# Patient Record
Sex: Male | Born: 1935 | Race: Asian | Hispanic: Yes | Smoking: Former smoker
Health system: Southern US, Community
[De-identification: ages and names within clinical notes are randomized; demographics above are authoritative.]

## PROBLEM LIST (undated history)

## (undated) DIAGNOSIS — I10 Essential (primary) hypertension: Secondary | ICD-10-CM

## (undated) DIAGNOSIS — G589 Mononeuropathy, unspecified: Secondary | ICD-10-CM

---

## 2015-06-10 ENCOUNTER — Inpatient Hospital Stay (HOSPITAL_COMMUNITY)
Admission: EM | Admit: 2015-06-10 | Discharge: 2015-06-13 | DRG: 064 | Disposition: A | Discharge status: Home / Self Care | Payer: Medicare Other | Attending: Neurology | Admitting: Neurology

## 2015-06-10 ENCOUNTER — Encounter (HOSPITAL_COMMUNITY): Payer: Self-pay | Admitting: Cardiology

## 2015-06-10 ENCOUNTER — Inpatient Hospital Stay (HOSPITAL_COMMUNITY): Payer: Medicare Other

## 2015-06-10 ENCOUNTER — Emergency Department (HOSPITAL_COMMUNITY): Payer: Medicare Other

## 2015-06-10 DIAGNOSIS — G936 Cerebral edema: Secondary | ICD-10-CM | POA: Diagnosis present

## 2015-06-10 DIAGNOSIS — I610 Nontraumatic intracerebral hemorrhage in hemisphere, subcortical: Secondary | ICD-10-CM | POA: Diagnosis not present

## 2015-06-10 DIAGNOSIS — R402142 Coma scale, eyes open, spontaneous, at arrival to emergency department: Secondary | ICD-10-CM | POA: Diagnosis present

## 2015-06-10 DIAGNOSIS — E876 Hypokalemia: Secondary | ICD-10-CM | POA: Diagnosis not present

## 2015-06-10 DIAGNOSIS — I619 Nontraumatic intracerebral hemorrhage, unspecified: Secondary | ICD-10-CM | POA: Diagnosis not present

## 2015-06-10 DIAGNOSIS — K5909 Other constipation: Secondary | ICD-10-CM | POA: Diagnosis not present

## 2015-06-10 DIAGNOSIS — R270 Ataxia, unspecified: Secondary | ICD-10-CM | POA: Diagnosis present

## 2015-06-10 DIAGNOSIS — R402362 Coma scale, best motor response, obeys commands, at arrival to emergency department: Secondary | ICD-10-CM | POA: Diagnosis present

## 2015-06-10 DIAGNOSIS — I611 Nontraumatic intracerebral hemorrhage in hemisphere, cortical: Principal | ICD-10-CM | POA: Diagnosis present

## 2015-06-10 DIAGNOSIS — E858 Other amyloidosis: Secondary | ICD-10-CM | POA: Diagnosis present

## 2015-06-10 DIAGNOSIS — R29702 NIHSS score 2: Secondary | ICD-10-CM | POA: Diagnosis present

## 2015-06-10 DIAGNOSIS — R402252 Coma scale, best verbal response, oriented, at arrival to emergency department: Secondary | ICD-10-CM | POA: Diagnosis present

## 2015-06-10 DIAGNOSIS — G500 Trigeminal neuralgia: Secondary | ICD-10-CM | POA: Diagnosis present

## 2015-06-10 DIAGNOSIS — I10 Essential (primary) hypertension: Secondary | ICD-10-CM | POA: Diagnosis present

## 2015-06-10 DIAGNOSIS — I6789 Other cerebrovascular disease: Secondary | ICD-10-CM | POA: Diagnosis not present

## 2015-06-10 HISTORY — DX: Mononeuropathy, unspecified: G58.9

## 2015-06-10 HISTORY — DX: Essential (primary) hypertension: I10

## 2015-06-10 LAB — COMPREHENSIVE METABOLIC PANEL
ALBUMIN: 4.3 g/dL (ref 3.5–5.0)
ALBUMIN: 3.9 g/dL (ref 3.5–5.0)
ALK PHOS: 58 U/L (ref 38–126)
ALK PHOS: 53 U/L (ref 38–126)
ALT: 19 U/L (ref 17–63)
ALT: 18 U/L (ref 17–63)
AST: 29 U/L (ref 15–41)
AST: 22 U/L (ref 15–41)
Anion gap: 16 — ABNORMAL HIGH (ref 5–15)
Anion gap: 8 (ref 5–15)
BUN: 15 mg/dL (ref 6–20)
BUN: 11 mg/dL (ref 6–20)
CALCIUM: 9 mg/dL (ref 8.9–10.3)
CO2: 18 mmol/L — ABNORMAL LOW (ref 22–32)
CO2: 23 mmol/L (ref 22–32)
CREATININE: 1.02 mg/dL (ref 0.61–1.24)
CREATININE: 0.71 mg/dL (ref 0.61–1.24)
Calcium: 9.3 mg/dL (ref 8.9–10.3)
Chloride: 106 mmol/L (ref 101–111)
Chloride: 106 mmol/L (ref 101–111)
GFR calc Af Amer: >60 mL/min (ref >60)
GFR calc Af Amer: >60 mL/min (ref >60)
GFR calc non Af Amer: >60 mL/min (ref >60)
GFR calc non Af Amer: >60 mL/min (ref >60)
GLUCOSE: 141 mg/dL — AB (ref 65–99)
GLUCOSE: 113 mg/dL — AB (ref 65–99)
POTASSIUM: 3.7 mmol/L (ref 3.5–5.1)
Potassium: 3.4 mmol/L — ABNORMAL LOW (ref 3.5–5.1)
SODIUM: 140 mmol/L (ref 135–145)
SODIUM: 137 mmol/L (ref 135–145)
TOTAL PROTEIN: 7.7 g/dL (ref 6.5–8.1)
Total Bilirubin: 1 mg/dL (ref 0.3–1.2)
Total Bilirubin: 0.9 mg/dL (ref 0.3–1.2)
Total Protein: 6.9 g/dL (ref 6.5–8.1)

## 2015-06-10 LAB — CBC
HCT: 41.5 % (ref 39.0–52.0)
HEMATOCRIT: 39.4 % (ref 39.0–52.0)
Hemoglobin: 14.2 g/dL (ref 13.0–17.0)
Hemoglobin: 13.6 g/dL (ref 13.0–17.0)
MCH: 31.8 pg (ref 26.0–34.0)
MCH: 31.6 pg (ref 26.0–34.0)
MCHC: 34.2 g/dL (ref 30.0–36.0)
MCHC: 34.5 g/dL (ref 30.0–36.0)
MCV: 92.8 fL (ref 78.0–100.0)
MCV: 91.6 fL (ref 78.0–100.0)
PLATELETS: 131 10*3/uL — AB (ref 150–400)
PLATELETS: 122 10*3/uL — AB (ref 150–400)
RBC: 4.47 MIL/uL (ref 4.22–5.81)
RBC: 4.3 MIL/uL (ref 4.22–5.81)
RDW: 12.9 % (ref 11.5–15.5)
RDW: 12.8 % (ref 11.5–15.5)
WBC: 8 10*3/uL (ref 4.0–10.5)
WBC: 7 10*3/uL (ref 4.0–10.5)

## 2015-06-10 LAB — APTT
aPTT: 29 seconds (ref 24–37)
aPTT: 31 seconds (ref 24–37)

## 2015-06-10 LAB — PROTIME-INR
INR: 1.11 (ref 0.00–1.49)
INR: 1.18 (ref 0.00–1.49)
PROTHROMBIN TIME: 14.5 s (ref 11.6–15.2)
Prothrombin Time: 15.2 seconds (ref 11.6–15.2)

## 2015-06-10 LAB — I-STAT CHEM 8, ED
BUN: 17 mg/dL (ref 6–20)
CALCIUM ION: 1.1 mmol/L — AB (ref 1.13–1.30)
CHLORIDE: 105 mmol/L (ref 101–111)
CREATININE: 0.9 mg/dL (ref 0.61–1.24)
Glucose, Bld: 133 mg/dL — ABNORMAL HIGH (ref 65–99)
HCT: 44 % (ref 39.0–52.0)
Hemoglobin: 15 g/dL (ref 13.0–17.0)
Potassium: 3.6 mmol/L (ref 3.5–5.1)
SODIUM: 141 mmol/L (ref 135–145)
TCO2: 18 mmol/L (ref 0–100)

## 2015-06-10 LAB — DIFFERENTIAL
Basophils Absolute: 0 10*3/uL (ref 0.0–0.1)
Basophils Relative: 1 %
Eosinophils Absolute: 0.1 10*3/uL (ref 0.0–0.7)
Eosinophils Relative: 2 %
LYMPHS PCT: 24 %
Lymphs Abs: 1.9 10*3/uL (ref 0.7–4.0)
MONO ABS: 0.5 10*3/uL (ref 0.1–1.0)
Monocytes Relative: 6 %
NEUTROS ABS: 5.5 10*3/uL (ref 1.7–7.7)
Neutrophils Relative %: 67 %

## 2015-06-10 LAB — CBG MONITORING, ED: Glucose-Capillary: 120 mg/dL — ABNORMAL HIGH (ref 65–99)

## 2015-06-10 LAB — I-STAT TROPONIN, ED: Troponin i, poc: 0 ng/mL (ref 0.00–0.08)

## 2015-06-10 LAB — MRSA PCR SCREENING: MRSA by PCR: NEGATIVE

## 2015-06-10 MED ORDER — SODIUM CHLORIDE 0.9 % IV SOLN
INTRAVENOUS | Status: DC
  Administered 2015-06-10 – 2015-06-12 (×3): via INTRAVENOUS

## 2015-06-10 MED ORDER — STROKE: EARLY STAGES OF RECOVERY BOOK
Freq: Once | Status: AC
  Administered 2015-06-10: 20:00:00
  Filled 2015-06-10: qty 1

## 2015-06-10 MED ORDER — HYDROMORPHONE HCL 1 MG/ML IJ SOLN
INTRAMUSCULAR | Status: AC
Start: 2015-06-10 — End: 2015-06-10
  Filled 2015-06-10: qty 1

## 2015-06-10 MED ORDER — HYDROMORPHONE HCL 1 MG/ML IJ SOLN
1.0000 mg | INTRAMUSCULAR | Status: DC | PRN
  Administered 2015-06-10 – 2015-06-11 (×2): 1 mg via INTRAVENOUS
  Filled 2015-06-10: qty 1

## 2015-06-10 MED ORDER — LABETALOL HCL 5 MG/ML IV SOLN
5.0000 mg | Freq: Once | INTRAVENOUS | Status: AC
  Administered 2015-06-10: 5 mg via INTRAVENOUS
  Filled 2015-06-10: qty 4

## 2015-06-10 MED ORDER — ACETAMINOPHEN 325 MG PO TABS: 650.0000 mg | ORAL_TABLET | ORAL | Status: DC | PRN

## 2015-06-10 MED ORDER — LABETALOL HCL 5 MG/ML IV SOLN
10.0000 mg | INTRAVENOUS | Status: DC | PRN
  Administered 2015-06-11 (×2): 20 mg via INTRAVENOUS
  Administered 2015-06-11: 40 mg via INTRAVENOUS
  Administered 2015-06-11 – 2015-06-13 (×6): 20 mg via INTRAVENOUS
  Filled 2015-06-10: qty 8
  Filled 2015-06-10: qty 4
  Filled 2015-06-10: qty 8
  Filled 2015-06-10: qty 4
  Filled 2015-06-10 (×4): qty 8
  Filled 2015-06-10: qty 4
  Filled 2015-06-10 (×3): qty 8

## 2015-06-10 MED ORDER — PANTOPRAZOLE SODIUM 40 MG IV SOLR
40.0000 mg | Freq: Every day | INTRAVENOUS | Status: DC
  Administered 2015-06-10 – 2015-06-11 (×2): 40 mg via INTRAVENOUS
  Filled 2015-06-10 (×2): qty 40

## 2015-06-10 MED ORDER — SENNOSIDES-DOCUSATE SODIUM 8.6-50 MG PO TABS
1.0000 | ORAL_TABLET | Freq: Two times a day (BID) | ORAL | Status: DC
  Administered 2015-06-11 – 2015-06-13 (×5): 1 via ORAL
  Filled 2015-06-10 (×6): qty 1

## 2015-06-10 MED ORDER — NICARDIPINE HCL IN NACL 20-0.86 MG/200ML-% IV SOLN
3.0000 mg/h | INTRAVENOUS | Status: DC
  Administered 2015-06-10: 5 mg/h via INTRAVENOUS
  Administered 2015-06-12: 3 mg/h via INTRAVENOUS
  Filled 2015-06-10 (×3): qty 200

## 2015-06-10 MED ORDER — ACETAMINOPHEN 650 MG RE SUPP: 650.0000 mg | RECTAL | Status: DC | PRN

## 2015-06-10 NOTE — ED Notes (Signed)
Pt to department via EMS- pt was LSN at 8am this morning. Wife reports at 11am that patient was acting confused and unable to dress himself. [place] then went [place] the airport and was found unresponsive in the bathroom. On EMS arrival pt was alert but confused. Pt reports he has had a headache for the past couple of days. Bp-156/96 Hr-94 CBg-122 Pt has weakness to the left side.

## 2015-06-10 NOTE — H&P (Signed)
H&P   Chief Complaint: Code stroke  HPI:                                                                                                                                         Todd Hoffman is an 79 y.o. male with known HTN.  Patient was last seen normal last night. This AM his wife noted he was putting his pants on backwards and not acting normal.  They went to the airport to catch a flight and he was found down in the bathroom. He has no recollection of this. EMS noted some left sided ataxia and called a code stroke.  CT was noted to show an acute hemorrhage right parietal lobe with surrounding edema and a small amount of subarachnoid hemorrhage with differential diagnosis that includes amyloid, trauma, vascular malformation and possibly underlying tumor.  Date last known well: Date: 06/09/2015 Time last known well: Unable to determine tPA Given: No: ICH Blood volume: 3.5 cc ICH score: 0    Past Medical History  Diagnosis Date  . Hypertension     History reviewed. No pertinent past surgical history.  Family History  Problem Relation Age of Onset  . Hypertension Mother   . Hypertension Father    Social History:  reports that he has never smoked. He does not have any smokeless tobacco history on file. He reports that he drinks alcohol. He reports that he does not use illicit drugs. Family history: no epilepsy, MS, or brain tumor Allergies: No Known Allergies  Medications:                                                                                                                           No current facility-administered medications for this encounter.   No current outpatient prescriptions on file.     ROS:  History obtained from the patient  General ROS: negative for - chills, fatigue, fever, night sweats, weight gain or weight  loss Psychological ROS: negative for - behavioral disorder, hallucinations, memory difficulties, mood swings or suicidal ideation Ophthalmic ROS: negative for - blurry vision, double vision, eye pain or loss of vision ENT ROS: negative for - epistaxis, nasal discharge, oral lesions, sore throat, tinnitus or vertigo Allergy and Immunology ROS: negative for - hives or itchy/watery eyes Hematological and Lymphatic ROS: negative for - bleeding problems, bruising or swollen lymph nodes Endocrine ROS: negative for - galactorrhea, hair pattern changes, polydipsia/polyuria or temperature intolerance Respiratory ROS: negative for - cough, hemoptysis, shortness of breath or wheezing Cardiovascular ROS: negative for - chest pain, dyspnea on exertion, edema or irregular heartbeat Gastrointestinal ROS: negative for - abdominal pain, diarrhea, hematemesis, nausea/vomiting or stool incontinence Genito-Urinary ROS: negative for - dysuria, hematuria, incontinence or urinary frequency/urgency Musculoskeletal ROS: negative for - joint swelling or muscular weakness Neurological ROS: as noted in HPI Dermatological ROS: negative for rash and skin lesion changes  Neurologic Examination:                                                                                                      Blood pressure 159/90, pulse 86, temperature 98.4 F (36.9 C), temperature source Oral, resp. rate 21, SpO2 96 %.  HEENT-  Normocephalic, no lesions, without obvious abnormality.  Normal external eye and conjunctiva.  Normal TM's bilaterally.  Normal auditory canals and external ears. Normal external nose, mucus membranes and septum.  Normal pharynx. Cardiovascular- S1, S2 normal, pulses palpable throughout   Lungs- chest clear, no wheezing, rales, normal symmetric air entry Abdomen- normal findings: bowel sounds normal Extremities- no edema Lymph-no adenopathy palpable Musculoskeletal-no joint tenderness, deformity or  swelling Skin-warm and dry, no hyperpigmentation, vitiligo, or suspicious lesions  Neurological Examination Mental Status: Alert, oriented, thought content appropriate.  Speech fluent without evidence of aphasia.  Able to follow 3 step commands without difficulty. Cranial Nerves: II: Discs flat bilaterally; Visual fields shows left hemianopsia, pupils equal, round, reactive to light and accommodation III,IV, VI: ptosis not present, extra-ocular motions intact bilaterally V,VII: smile symmetric, facial light touch sensation decreased on the left VIII: hearing normal bilaterally IX,X: uvula rises symmetrically XI: bilateral shoulder shrug XII: midline tongue extension Motor: Right : Upper extremity   5/5    Left:     Upper extremity   5/5  Lower extremity   5/5     Lower extremity   5/5 Tone and bulk:normal tone throughout; no atrophy noted Sensory: Pinprick and light touch decreased on the left Deep Tendon Reflexes: 2+ and symmetric throughout Plantars: Right: downgoing   Left: downgoing Cerebellar: normal finger-to-nose and normal heel-to-shin test Gait: Not tested     Lab Results: Basic Metabolic Panel:  Recent Labs Lab 06/10/15 1442 06/10/15 1448  NA 140 141  K 3.7 3.6  CL 106 105  CO2 18*  --   GLUCOSE 141* 133*  BUN 15 17  CREATININE 1.02 0.90  CALCIUM 9.3  --  Liver Function Tests:  Recent Labs Lab 06/10/15 1442  AST 29  ALT 19  ALKPHOS 58  BILITOT 1.0  PROT 7.7  ALBUMIN 4.3   No results for input(s): LIPASE, AMYLASE in the last 168 hours. No results for input(s): AMMONIA in the last 168 hours.  CBC:  Recent Labs Lab 06/10/15 1442 06/10/15 1448  WBC 8.0  --   NEUTROABS 5.5  --   HGB 14.2 15.0  HCT 41.5 44.0  MCV 92.8  --   PLT 131*  --     Cardiac Enzymes: No results for input(s): CKTOTAL, CKMB, CKMBINDEX, TROPONINI in the last 168 hours.  Lipid Panel: No results for input(s): CHOL, TRIG, HDL, CHOLHDL, VLDL, LDLCALC in the last 168  hours.  CBG:  Recent Labs Lab 06/10/15 1511  GLUCAP 120*    Microbiology: No results found for this or any previous visit.  Coagulation Studies:  Recent Labs  06/10/15 1442  LABPROT 14.5  INR 1.11    Imaging: Ct Head Wo Contrast  06/10/2015  CLINICAL DATA:  Code stroke.  Left-sided weakness. EXAM: CT HEAD WITHOUT CONTRAST TECHNIQUE: Contiguous axial images were obtained from the base of the skull through the vertex without intravenous contrast. COMPARISON:  None. FINDINGS: Acute hemorrhage in the right parietal lobe. This is a peripheral parenchymal hemorrhage measuring 25 x 16 mm. There is a small amount of adjacent subarachnoid hemorrhage. No subdural hemorrhage. Moderate atrophy and moderate chronic microvascular ischemia. Negative for acute ischemic infarction. No underlying mass lesion identified Calvarium intact. IMPRESSION: 25 x 16 mm acute hemorrhage right parietal lobe with surrounding edema and a small amount of subarachnoid hemorrhage. Differential diagnosis includes amyloid, trauma, vascular malformation and possibly underlying tumor. No definite tumor mass is identified. Critical Value/emergent results were called by telephone at the time of interpretation on 06/10/2015 at 3:06 pm to Dr. Leroy Kennedy, who verbally acknowledged these results. Electronically Signed   By: Marlan Palau M.D.   On: 06/10/2015 15:06       Assessment and plan discussed with with attending physician and they are in agreement.    Felicie Morn PA-C [facility] [phone]  06/10/2015, 3:35 PM   Assessment: 79 y.o. male presenting to ED as a code stroke after being found down in the airport. CT head confirms an acute hemorrhage right parietal lobe with surrounding edema and a small amount of subarachnoid hemorrhage with differential diagnosis that includes amyloid, trauma, vascular malformation and possibly underlying tumor.   ICH score 0 Admit to NICU.   Stroke Risk Factors -  hypertension  Recommend: 1) admit to neuro ICU 2) SBP <140 and DBP<90 3) NPO until SLP eval 4) MRI/MRA brain with and without contrast Stroke team will follow up tomorrow.  Patient seen and examined together with physician assistant and I concur with the assessment and plan.  Wyatt Portela, MD

## 2015-06-10 NOTE — Code Documentation (Signed)
79 [facility] that was called in the field by [facility].  Wife reports that patient has complained of headache for several days - this morning he awakened and seemed normal but had some confusion - i.e. Putting clothes on backwards etc.  He was ambulatory and assisted with house cleaning.  [place] - he went to the restroom and when he did not come back in a timely manner the wife sent someone in looking for him where he was found on the floor.  EMS was called and they saw some ataxia and confusion.  On arrival to ED he is alert - a little slow to respond to questions and is confused as times.  He has a left field cut but does not extinct.  Pupils are 3mm ERL.  NIHHS 2 for field cut and sensory loss although sensory loss is inconsistent.  He does report some neuropathy.  LSW is questionable if he awoke with symptoms.  CT scan show ICH. BP control began with Labetalol.  Handoff to [employer] RN - BP goal is <140/90.  Patient and wife updated by Felicie Mornavid Smith PA and Dr. Cyril Mourningamillo.

## 2015-06-10 NOTE — ED Notes (Signed)
Pt requesting to use  Bathroom, urinal given to pt.  Family at bedside

## 2015-06-10 NOTE — ED Provider Notes (Signed)
CSN: [id number]     Arrival date & time 06/10/15  1437 History   First MD Initiated Contact with Patient 06/10/15 1443     Chief Complaint  Patient presents with  . Code Stroke   Patient is a 79 y.o. male presenting with neurologic complaint. The history is provided by the patient.  Neurologic Problem This is a new problem. The current episode started today. The problem occurs constantly. The problem has been unchanged. Associated symptoms include diaphoresis, headaches and weakness. Pertinent negatives include no abdominal pain, chest pain, chills, coughing, fever, nausea, neck pain, rash, sore throat, vertigo or vomiting. Nothing aggravates the symptoms.    Past Medical History  Diagnosis Date  . Hypertension    History reviewed. No pertinent past surgical history. No family history on file. Social History  Substance Use Topics  . Smoking status: Never Smoker   . Smokeless tobacco: None  . Alcohol Use: Yes    Review of Systems  Constitutional: Positive for diaphoresis. Negative for fever and chills.  HENT: Negative for rhinorrhea and sore throat.   Eyes: Negative for visual disturbance.  Respiratory: Negative for cough and shortness of breath.   Cardiovascular: Negative for chest pain.  Gastrointestinal: Negative for nausea, vomiting, abdominal pain, diarrhea and constipation.  Genitourinary: Negative for dysuria and hematuria.  Musculoskeletal: Negative for back pain and neck pain.  Skin: Negative for rash.  Neurological: Positive for weakness and headaches. Negative for vertigo and syncope.  Psychiatric/Behavioral: Positive for confusion.  All other systems reviewed and are negative.  Allergies  Review of patient's allergies indicates no known allergies.  Home Medications   Prior to Admission medications   Not on File   BP 166/89 mmHg  Pulse 92  Temp(Src) 98.4 F (36.9 C) (Oral)  Resp 18  SpO2 95% Physical Exam  Constitutional: He is oriented to person,  place, and time. He appears well-developed and well-nourished. No distress.  HENT:  Head: Normocephalic and atraumatic.  Mouth/Throat: Oropharynx is clear and moist.  Eyes: EOM are normal.  Neck: Neck supple. No JVD present.  Cardiovascular: Normal rate, regular rhythm, normal heart sounds and intact distal pulses.   Pulmonary/Chest: Effort normal and breath sounds normal.  Abdominal: Soft. He exhibits no distension. There is no tenderness.  Musculoskeletal: Normal range of motion. He exhibits no edema.  Neurological: He is alert and oriented to person, place, and time. He has normal strength. No cranial nerve deficit or sensory deficit. Coordination normal. GCS eye subscore is 4. GCS verbal subscore is 5. GCS motor subscore is 6.  Reflex Scores:      Bicep reflexes are 2+ on the right side and 2+ on the left side.      Brachioradialis reflexes are 2+ on the right side and 2+ on the left side.      Patellar reflexes are 2+ on the right side and 2+ on the left side. Left visual field cut. 5/5 BUE and BLE strength. Symmetric sensation.  Skin: Skin is warm and dry.  Psychiatric: His behavior is normal.    ED Course  Procedures  None   Labs Review Labs Reviewed  CBC - Abnormal; Notable for the following:    Platelets 131 (*)    All other components within normal limits  I-STAT CHEM 8, ED - Abnormal; Notable for the following:    Glucose, Bld 133 (*)    Calcium, Ion 1.10 (*)    All other components within normal limits  PROTIME-INR  APTT  DIFFERENTIAL  COMPREHENSIVE METABOLIC PANEL  I-STAT TROPOININ, ED  CBG MONITORING, ED    Imaging Review Ct Head Wo Contrast  06/10/2015  CLINICAL DATA:  Code stroke.  Left-sided weakness. EXAM: CT HEAD WITHOUT CONTRAST TECHNIQUE: Contiguous axial images were obtained from the base of the skull through the vertex without intravenous contrast. COMPARISON:  None. FINDINGS: Acute hemorrhage in the right parietal lobe. This is a peripheral  parenchymal hemorrhage measuring 25 x 16 mm. There is a small amount of adjacent subarachnoid hemorrhage. No subdural hemorrhage. Moderate atrophy and moderate chronic microvascular ischemia. Negative for acute ischemic infarction. No underlying mass lesion identified Calvarium intact. IMPRESSION: 25 x 16 mm acute hemorrhage right parietal lobe with surrounding edema and a small amount of subarachnoid hemorrhage. Differential diagnosis includes amyloid, trauma, vascular malformation and possibly underlying tumor. No definite tumor mass is identified. Critical Value/emergent results were called by telephone at the time of interpretation on 06/10/2015 at 3:06 pm to Dr. Leroy KennedyAmilo, who verbally acknowledged these results. Electronically Signed   By: Marlan Palauharles  Clark M.D.   On: 06/10/2015 15:06   I have personally reviewed and evaluated these images and lab results as part of my medical decision-making.  MDM   Final diagnoses:  Intraparenchymal hemorrhage of brain [facility](HCC)   79 year old male presents by EMS for code stroke. Patient's last known normal was 11 AM (approximately 3 hours and 45 minutes prior to arrival). Patient was noted to be acting confused, putting his clothes on backwards. He was at the airport and someone found him down and a restroom stall. No evidence of trauma. GCS 14. Airway intact and maintainable. Left visual field cut. Strong motor exam bilaterally. Patient taken to CT scanner. CT shows acute right parietal IPH. Neurology at bedside, updating family. Will admit to Neurology service. Care assumed by them.  Discussed with Dr. Jodi MourningZavitz.    Maris BergerJonah Margareta Laureano, MD 06/10/15 1512  Blane OharaJoshua Zavitz, MD 06/10/15 [phone]

## 2015-06-10 NOTE — ED Notes (Signed)
Stroke team remains at the bedside. 

## 2015-06-11 ENCOUNTER — Inpatient Hospital Stay (HOSPITAL_COMMUNITY): Payer: Medicare Other

## 2015-06-11 ENCOUNTER — Encounter (HOSPITAL_COMMUNITY): Payer: Self-pay

## 2015-06-11 DIAGNOSIS — G936 Cerebral edema: Secondary | ICD-10-CM

## 2015-06-11 DIAGNOSIS — I6789 Other cerebrovascular disease: Secondary | ICD-10-CM

## 2015-06-11 LAB — GLUCOSE, CAPILLARY
GLUCOSE-CAPILLARY: 108 mg/dL — AB (ref 65–99)
GLUCOSE-CAPILLARY: 95 mg/dL (ref 65–99)
Glucose-Capillary: 104 mg/dL — ABNORMAL HIGH (ref 65–99)

## 2015-06-11 MED ORDER — GABAPENTIN 400 MG PO CAPS
400.0000 mg | ORAL_CAPSULE | Freq: Three times a day (TID) | ORAL | Status: DC
  Administered 2015-06-11 – 2015-06-12 (×5): 400 mg via ORAL
  Filled 2015-06-11 (×6): qty 1

## 2015-06-11 MED ORDER — POTASSIUM CHLORIDE CRYS ER 20 MEQ PO TBCR
20.0000 meq | EXTENDED_RELEASE_TABLET | ORAL | Status: AC
  Administered 2015-06-11 (×2): 20 meq via ORAL
  Filled 2015-06-11 (×2): qty 1

## 2015-06-11 NOTE — Evaluation (Signed)
Speech Language Pathology Evaluation Patient Details Name: Todd LivingsRobert Hoffman MRN: [id number] DOB: 01/10/1936 Today's Date: 06/11/2015 Time: [id number] SLP Time Calculation (min) (ACUTE ONLY): 28 min  Problem List:  Patient Active Problem List   Diagnosis Date Noted  . ICH (intracerebral hemorrhage) (HCC) 06/10/2015   Past Medical History:  Past Medical History  Diagnosis Date  . Hypertension    Past Surgical History: History reviewed. No pertinent past surgical history. HPI:  pt presents with R Parietal and Occipital lobe ICH. pt with hx of HTN.    Assessment / Plan / Recommendation Clinical Impression  Pt scored a 22/30 on the MoCA (below 26 indicative of cognitive impairment), with errors in large part due to poor sustained attention, impulsivity, and decreased working memory. This makes storage of new information very difficult, although he actually has adequate delayed recall when information is able to be encoded. Difficulties are also noted with basic functional problem solving and more complex verbal problem solving. Word finding errors are noted at the conversational level, and are likely a result of impaired cognitive function. Despite specific testing errors explained to pt, he still needed Total A for awareness of difficulties, making safety a big concern. Given his higher level of verbal function, pt tries to provide explanations for all of the functional deficits he shows. Wife and son present for SLP recommendations and education. Recommend acute f/u as well as 24/7 supervision and OP SLP upon discharge.    SLP Assessment  Patient needs continued Speech Lanaguage Pathology Services    Follow Up Recommendations  Outpatient SLP;24 hour supervision/assistance    Frequency and Duration min 2x/week  2 weeks      SLP Evaluation Prior Functioning  Cognitive/Linguistic Baseline: Within functional limits Type of Home: House  Lives With: Spouse Available Help at Discharge:  Family Vocation: Part time employment   Cognition  Overall Cognitive Status: Impaired/Different from baseline Arousal/Alertness: Awake/alert Orientation Level: Oriented X4 Attention: Sustained Sustained Attention: Impaired Sustained Attention Impairment: Verbal basic;Functional basic Memory: Impaired Memory Impairment: Storage deficit Awareness: Impaired Awareness Impairment: Intellectual impairment;Emergent impairment;Anticipatory impairment Problem Solving: Impaired Problem Solving Impairment: Functional basic;Verbal complex Behaviors: Impulsive Safety/Judgment: Impaired    Comprehension  Auditory Comprehension Overall Auditory Comprehension: Impaired Commands: Impaired One Step Basic Commands: 75-100% accurate Complex Commands: 25-49% accurate Conversation: Simple Interfering Components: Attention;Working [employer] fast streamermemory;Other (comment) (impulsivity)    Expression Expression Primary Mode of Expression: Verbal Verbal Expression Overall Verbal Expression: Impaired Initiation: No impairment Level of Generative/Spontaneous Verbalization: Conversation Naming: Impairment Verbal Errors: Other (comment) (word-finding errors in conversation) Non-Verbal Means of Communication: Not applicable Written Expression Dominant Hand: Right   Oral / Motor Oral Motor/Sensory Function Overall Oral Motor/Sensory Function: Within functional limits Motor Speech Overall Motor Speech: Appears within functional limits for tasks assessed     Maxcine HamLaura Paiewonsky, M.A. CCC-SLP [phone]  Maxcine Hamaiewonsky, Todd Hoffman 06/11/2015, 4:29 PM

## 2015-06-11 NOTE — Progress Notes (Signed)
STROKE TEAM PROGRESS NOTE   HISTORY Todd Hoffman is an 79 y.o. male with known HTN. Patient was last seen normal last night (LKW 06/09/2015, unable to determine time). This AM 06/10/2015 his wife noted he was putting his pants on backwards and not acting normal. [place] went to the airport to [place] a flight and he was found down in the bathroom. He has no recollection of this. EMS noted some left sided ataxia and called a code stroke. CT was noted to show an acute hemorrhage right parietal lobe with surrounding edema and a small amount of subarachnoid hemorrhage with differential diagnosis that includes amyloid, trauma, vascular malformation and possibly underlying tumor. Blood volume: 3.5 cc ICH score: 0. He was admitted to the neuro ICU for further evaluation and treatment.   SUBJECTIVE (INTERVAL HISTORY) No family is at the bedside.  Overall he feels his condition is improving. Patient unable to recount yesterday's history with Dr. Pearlean Brownie.    OBJECTIVE Temp:  [98.2 F (36.8 C)-98.6 F (37 C)] 98.2 F (36.8 C) (12/13 0756) Pulse Rate:  [56-97] 74 (12/13 0800) Cardiac Rhythm:  [-] Normal sinus rhythm (12/13 0800) Resp:  [9-22] 18 (12/13 0800) BP: (97-169)/(60-101) 137/70 mmHg (12/13 0800) SpO2:  [92 %-100 %] 97 % (12/13 0800) Weight:  [79.7 kg (175 lb 11.3 oz)] 79.7 kg (175 lb 11.3 oz) (12/12 2300)  CBC:   Recent Labs Lab 06/10/15 1442 06/10/15 1448 06/10/15 2030  WBC 8.0  --  7.0  NEUTROABS 5.5  --   --   HGB 14.2 15.0 13.6  HCT 41.5 44.0 39.4  MCV 92.8  --  91.6  PLT 131*  --  122*    Basic Metabolic Panel:   Recent Labs Lab 06/10/15 1442 06/10/15 1448 06/10/15 2030  NA 140 141 137  K 3.7 3.6 3.4*  CL 106 105 106  CO2 18*  --  23  GLUCOSE 141* 133* 113*  BUN CREATININE 1.02 0.90 0.71  CALCIUM 9.3  --  9.0    Lipid Panel: No results found for: CHOL, TRIG, HDL, CHOLHDL, VLDL, LDLCALC HgbA1c: No results found for: HGBA1C Urine Drug Screen: No results  found for: LABOPIA, COCAINSCRNUR, LABBENZ, AMPHETMU, THCU, LABBARB    IMAGING  Ct Head Wo Contrast 06/11/2015   1. Stable size of right occipital 26 x 16 mm with parenchymal hemorrhage with mild localized vasogenic edema. 2. Slight interval decrease in adjacent small volume acute subarachnoid hemorrhage within the right parietal lobe. 3. No other new intracranial process.   Ct Head Wo Contrast 06/10/2015  25 x 16 mm acute hemorrhage right parietal lobe with surrounding edema and a small amount of subarachnoid hemorrhage. Differential diagnosis includes amyloid, trauma, vascular malformation and possibly underlying tumor. No definite tumor mass is identified.   MRI HEAD  06/10/2015  1. 26 x 14 x 18 mm acute parenchymal hemorrhage within the right occipital lobe with mild localized vasogenic edema and small amount of adjacent subarachnoid hemorrhage. Primary differential considerations include possible trauma or amyloid angiopathy. No definite underlying mass lesion on this noncontrast examination. No underlying vascular malformation. 2. Moderate chronic microvascular ischemic disease.   MRA HEAD  06/10/2015  1. Short segment severe stenosis within the mid left P2 segment. 2. Otherwise negative intracranial MRA.      PHYSICAL EXAM Pleasant elderly Hispanic male not in distress. . Afebrile. Head is nontraumatic. Neck is supple without bruit.    Cardiac exam no murmur or gallop. Lungs are clear  to auscultation. Distal pulses are well felt. Neurological Exam :  Awake alert oriented to time place and person. Speech is fluent without dysarthria or aphagia. Extraocular movements are full range without nystagmus. Fundi were not visualized. Vision acuity seems adequate. Partial left homonymous hemianopsia to bedside confrontational testing. Pupils equal reactive. Face appears symmetric with only mild minimal left lower facial asymmetry on smiling. Tongue midline. Motor system exam revealed no upper or  lower extremity drift but mild weakness of left grip and intrinsic hand muscles. Orbits right over left upper extremity. Symmetric lower extremity strength. Deep tendon reflexes are 2+ symmetric. Plantars are downgoing. Touch and pinprick sensation are preserved bilaterally. Coordination is intact. Gait was not tested. ASSESSMENT/PLAN Mr. Todd Hoffman is a 79 y.o. male with history of hypertension found down in the airport bathroom with left-sided ataxia, confusion prior to. Pt reports straining with BM at symptom onset. CT showed a right parietal hemorrhage.  Right parietal/occipital lobe hemorrhage with surrounding cerebral edema and small SAH hemorrhage, most likely hemorrhagic infarct vs hemorrhage due to amyloid. Doubt underlying tumor or hypertensive etiology at this time  Good memory, able to recall 3 objects in 3 minutes  Initial CT with right parietal/occipital lobe hemorrhage With edema and subarachnoid hemorrhage. No tumor identified.  Repeat CT with right parietal/occipital intraparenchymal hemorrhage with localized edema and slight decrease in subarachnoid hemorrhage in the right parietal  MRI  right parietal/occipital lobe hemorrhage with localized cerebral edema and adjacent subarachnoid hemorrhage. No tumor seen  MRA  Left P2 mid segment with severe stenosis, otherwise unremarkable  Carotid Doppler  pending   2D Echo  Ordered  HgbA1c pending  Lipids pending   SCDs for VTE prophylaxis Diet Heart Room service appropriate?: Yes; Fluid consistency:: Thin  No antithrombotic prior to admission  Ongoing aggressive stroke risk factor management  Therapy recommendations:  Pending. Ok to be OOB  Continue ICU level care x 1 day  Disposition:  pending   Hypertension  BP 152/90 on arrival  Had been started on cardene, now off  Remains Stable  At SBP goal < 140  Other Stroke Risk Factors  Advanced age  ETOH use  Other Active Problems  Thrombocytopenia, PLT  122  Hypokalemia, K 3.4 replace with Kdur 20 meq x 2  Trigeminal neuralgia  [place]. No[place]  On  Gabapentin at home, 300 tid daily  Still complains of pain  Will increase to 400 tid  Constipation  Chronic  colonscopy done PTA within past 2 years, no abnormal findings per pt  Hospital day # 1  Rhoderick MoodyBIBY,SHARON  [facility] See Amion for Pager information 06/11/2015 9:26 AM  I have personally examined this patient, reviewed notes, independently viewed imaging studies, participated in medical decision making and plan of care. I have made any additions or clarifications directly to the above note. Agree with note above. He presented with altered mental status, confusion and left-sided ataxia secondary to a small right parietal hemorrhage etiology indeterminate at the present time. He remains at risk for hematoma expansion, neurological worsening, seizures and needs ongoing close neurological follow-up and aggressive blood pressure control. No family available at the bedside This patient is critically ill and at significant risk of neurological worsening, death and care requires constant monitoring of vital signs, hemodynamics,respiratory and cardiac monitoring, extensive review of multiple databases, frequent neurological assessment, discussion with family, other specialists and medical decision making of high complexity.I have made any additions or  clarifications directly to the above note.This critical care time does not reflect procedure time, or teaching time or supervisory time of PA/NP/Med Resident etc but could involve care discussion time.  I spent 30 minutes of neurocritical care time  in the care of  this patient  Delia Heady, MD Medical Director [facility] Pager: [phone] 06/11/2015 3:32 PM    To contact Stroke Continuity provider, please refer to [email or web address]. After hours, contact General Neurology

## 2015-06-11 NOTE — Progress Notes (Signed)
PT Cancellation Note  Patient Details Name: Todd LivingsRobert Copes MRN: [id number] DOB: [birth year]   Cancelled Treatment:    Reason Eval/Treat Not Completed: Patient not medically ready.  Pt currently on bedrest at this time.  Please advance activity order once appropriate for PT and mobility.     [name], [name] 06/11/2015, 8:16 AM

## 2015-06-11 NOTE — Evaluation (Signed)
Physical Therapy Evaluation Patient Details Name: Todd Hoffman MRN: [id number] DOB: December 03, 1935 Today's Date: 06/11/2015   History of Present Illness  pt presents with R Parietal and Occipital lobe ICH.  pt with hx of HTN.    Clinical Impression  Pt moving well, but safety is limited by impulsivity and impaired cognition.  Pt has poor awareness of deficits and of his safety with mobility.  Feel pt would benefit from Speech for Cognition and will need A from family at home for med management and safety.  Will continue to follow.      Follow Up Recommendations Outpatient PT;Supervision for mobility/OOB    Equipment Recommendations  None recommended by PT    Recommendations for Other Services       Precautions / Restrictions Precautions Precautions: Other (comment) Precaution Comments: Impulsive Restrictions Weight Bearing Restrictions: No      Mobility  Bed Mobility Overal bed mobility: Modified Independent                Transfers Overall transfer level: Needs assistance Equipment used: None Transfers: Sit to/from Stand Sit to Stand: Supervision         General transfer comment: pt moves impulsively.  cues to attend to safety and lines.    Ambulation/Gait Ambulation/Gait assistance: Min guard Ambulation Distance (Feet): 150 Feet Assistive device: None Gait Pattern/deviations: Step-through pattern;Decreased stride length     General Gait Details: pt moves quickly and impulsively.  pt has difficulty multitasking with performing cognitive tasks and ambulating at the same time.  pt was able to find his room without A.    Stairs            Wheelchair Mobility    Modified Rankin (Stroke Patients Only) Modified Rankin (Stroke Patients Only) Pre-Morbid Rankin Score: No symptoms Modified Rankin: Moderately severe disability     Balance Overall balance assessment: Needs assistance Sitting-balance support: No upper extremity supported;Feet  supported Sitting balance-Leahy Scale: Good     Standing balance support: No upper extremity supported;During functional activity Standing balance-Leahy Scale: Fair Standing balance comment: Difficulties when multi tasking                             Pertinent Vitals/Pain Pain Assessment: No/denies pain    Home Living Family/patient expects to be discharged to:: Private residence Living Arrangements: Spouse/significant other Available Help at Discharge: Family (Unclear amount of A wife can provide.) Type of Home: House         Home Equipment: None Additional Comments: pt lives in [name], but was visiting family in [name] and has a home in [name] near family.  Per wife the plan is to return to [place] when able.      Prior Function Level of Independence: Independent               Hand Dominance        Extremity/Trunk Assessment   Upper Extremity Assessment: Defer to OT evaluation           Lower Extremity Assessment: Overall WFL for tasks assessed      Cervical / Trunk Assessment: Normal  Communication   Communication: No difficulties  Cognition Arousal/Alertness: Awake/alert Behavior During Therapy: Impulsive Overall Cognitive Status: Impaired/Different from baseline Area of Impairment: Attention;Memory;Following commands;Orientation;Safety/judgement;Awareness;Problem solving   Current Attention Level: Alternating Memory: Decreased short-term memory (No memory of yesterday.) Following Commands: Follows one step commands consistently;Follows multi-step commands inconsistently Safety/Judgement: Decreased awareness of safety;Decreased awareness of  deficits Awareness: Emergent Problem Solving: Difficulty sequencing;Requires verbal cues;Requires tactile cues General Comments: pt impulsive and needs max education for safety.  pt getting up from recliner setting off chair alarm less than 5 mins after being told not to get up without A.      General  Comments      Exercises        Assessment/Plan    PT Assessment Patient needs continued PT services  PT Diagnosis Difficulty walking;Altered mental status   PT Problem List Decreased activity tolerance;Decreased balance;Decreased coordination;Decreased mobility;Decreased cognition;Decreased knowledge of use of DME;Decreased safety awareness  PT Treatment Interventions DME instruction;Gait training;Stair training;Functional mobility training;Therapeutic activities;Therapeutic exercise;Balance training;Neuromuscular re-education;Cognitive remediation;Patient/family education   PT Goals (Current goals can be found in the Care Plan section) Acute Rehab PT Goals Patient Stated Goal: Home PT Goal Formulation: With patient Time For Goal Achievement: 06/25/15 Potential to Achieve Goals: Good    Frequency Min 4X/week   Barriers to discharge        Co-evaluation               End of Session Equipment Utilized During Treatment: Gait belt Activity Tolerance: Patient tolerated treatment well Patient left: in chair;with call bell/phone within reach;with chair alarm set Nurse Communication: Mobility status         Time: [id number] PT Time Calculation (min) (ACUTE ONLY): 13 min   Charges:   PT Evaluation $Initial PT Evaluation Tier I: 1 Procedure     PT G CodesSunny Hoffman:        Todd Hoffman, Todd Hoffman [phone] 06/11/2015, 11:52 AM

## 2015-06-11 NOTE — Progress Notes (Signed)
*  PRELIMINARY RESULTS* Vascular Ultrasound Carotid Duplex (Doppler) has been completed.  Study was technically difficult due to patient anatomy, vessel tortuosity, and depth of vessels. Findings suggest 1-39% internal carotid artery stenosis bilaterally. Vertebral arteries are patent with antegrade flow bilaterally. The left vertebral artery exhibits an atypical waveform; etiology unknown.  06/11/2015 11:50 AM Gertie FeyMichelle Michaeljames Milnes, RVT, RDCS, RDMS

## 2015-06-11 NOTE — Evaluation (Signed)
Occupational Therapy Evaluation Patient Details Name: Todd LivingsRobert Hoffman MRN: [id number] DOB: [birth year] Today's Date: 06/11/2015    History of Present Illness pt presents with R Parietal and Occipital lobe ICH.  pt with hx of HTN.     Clinical Impression   PT admitted with above stated deficits. Pt currently with functional limitiations due to the deficits listed below (see OT problem list). PTA was independent with all adls including driving Pt will benefit from skilled OT to increase their independence and safety with adls and balance to allow discharge outpatient for balance and cognitive deficits.     Follow Up Recommendations  Outpatient OT    Equipment Recommendations  None recommended by OT    Recommendations for Other Services       Precautions / Restrictions Precautions Precautions: Fall Precaution Comments: Impulsive      Mobility Bed Mobility Overal bed mobility: Needs Assistance Bed Mobility: Sit to Supine       Sit to supine: Min guard   General bed mobility comments: in chair on arrival and end of session in bed for testing. pt needed cues to safety. Pt standing front sitting without warning.  Transfers Overall transfer level: Needs assistance Equipment used: None Transfers: Sit to/from Stand Sit to Stand: Supervision         General transfer comment: very impulsive    Balance Overall balance assessment: Needs assistance   Sitting balance-Leahy Scale: Good       Standing balance-Leahy Scale: Fair                 High Level Balance Comments: difficulty with walking backward            ADL Overall ADL's : Needs assistance/impaired                     Lower Body Dressing: Min guard;Sitting/lateral leans Lower Body Dressing Details (indicate cue type and reason): pt needed multiple cues to adjust sock but able to preform task. pt with cognitive deficits making the task more difficult Toilet Transfer: Min Armed forces training and education officerguard Toilet  Transfer Details (indicate cue type and reason): pt very impulsive and moving quickly through the task         Functional mobility during ADLs: Min guard General ADL Comments: pt required mod cues to complete basic balance assessment tests. pt demonstrates cognitive deficits affecting all adls.      Vision Vision Assessment?: No apparent visual deficits Additional Comments: to assess further but no apparent deficit with this session   Perception     Praxis      Pertinent Vitals/Pain Pain Assessment: No/denies pain     Hand Dominance Right   Extremity/Trunk Assessment Upper Extremity Assessment Upper Extremity Assessment: Overall WFL for tasks assessed   Lower Extremity Assessment Lower Extremity Assessment: Defer to PT evaluation   Cervical / Trunk Assessment Cervical / Trunk Assessment: Normal   Communication Communication Communication: No difficulties   Cognition Arousal/Alertness: Awake/alert Behavior During Therapy: Impulsive Overall Cognitive Status: Impaired/Different from baseline Area of Impairment: Attention;Memory;Following commands;Orientation;Safety/judgement;Awareness;Problem solving   Current Attention Level: Alternating Memory: Decreased short-term memory Following Commands: Follows one step commands consistently;Follows multi-step commands inconsistently Safety/Judgement: Decreased awareness of safety;Decreased awareness of deficits Awareness: Emergent Problem Solving: Difficulty sequencing;Requires verbal cues;Requires tactile cues General Comments: pt impulsive and needs max education for safety.  pt getting up from recliner setting off chair alarm less than 5 mins after being told not to get up without A.  pt calling environmental service  employee the ultra sound techs name and when questioned how do you know that gentleman. pt states "oh he just came and did my test in my room". Pt requires multiple attempts at questions to get a open ended  response. pt very closed answer response "yes no ". Pt able to respond to rapid fire questions but when provided a pause between questions response become more challenging for patient. pt oriented to place "hospital" , how much money a quarter is and what 25 cent take away a dime equals. pt reports first memory being his wife waking him up.    General Comments       Exercises       Shoulder Instructions      Home Living Family/patient expects to be discharged to:: Private residence Living Arrangements: Spouse/significant other Available Help at Discharge: Family Type of Home: House                       Home Equipment: None   Additional Comments: pt lives in [place], but was visiting family in [name] and has a home in [name] near family.  Per wife the plan is to return to [place] when able.        Prior Functioning/Environment Level of Independence: Independent             OT Diagnosis: Cognitive deficits   OT Problem List: Decreased strength;Decreased activity tolerance;Impaired balance (sitting and/or standing);Decreased cognition;Decreased safety awareness;Decreased knowledge of use of DME or AE;Decreased knowledge of precautions   OT Treatment/Interventions: Self-care/ADL training;Therapeutic exercise;DME and/or AE instruction;Therapeutic activities;Cognitive remediation/compensation;Balance training;Patient/family education    OT Goals(Current goals can be found in the care plan section) Acute Rehab OT Goals Patient Stated Goal: Home OT Goal Formulation: With patient/family Time For Goal Achievement: 06/25/15 Potential to Achieve Goals: Good  OT Frequency: Min 2X/week   Barriers to D/C:    from [place] and will have to fly back to [place]. wife wanting to take patient asap. Question patients cognitive deficits with this task at this present time       Co-evaluation              End of Session Equipment Utilized During Treatment: Gait belt Nurse Communication:  Mobility status;Precautions  Activity Tolerance: Patient tolerated treatment well Patient left: in bed;with call bell/phone within reach;with family/visitor present   Time: 1115-1131 OT Time Calculation (min): 16 min Charges:  OT General Charges $OT Visit: 1 Procedure OT Evaluation $Initial OT Evaluation Tier I: 1 Procedure G-Codes:    Harolyn Rutherford 30-Jun-2015, 3:09 PM   Mateo Flow   OTR/L Pager: [phone] Office: [phone] .

## 2015-06-11 NOTE — Progress Notes (Signed)
OT Cancellation Note  Patient Details Name: Todd LivingsRobert Hoffman MRN: [id number] DOB: 1935/07/17   Cancelled Treatment:    Reason Eval/Treat Not Completed: Patient not medically ready (bedrest orders in order set) Please increase activity orders as appropriate.  Felecia ShellingJones, Oswell Say B   Prentiss Hammett, Brynn   OTR/L Pager: [phone] Office: [phone] .  06/11/2015, 7:34 AM

## 2015-06-11 NOTE — Care Management Note (Signed)
Case Management Note  Patient Details  Name: Lemar LivingsRobert Banning MRN: [id number] Date of Birth: 03/14/1936  Subjective/Objective:   Pt admitted on 06/10/15 with ICH.  PTA, pt independent, lives with spouse.                   Action/Plan: Will follow for discharge planning as pt progresses.    Expected Discharge Date:                  Expected Discharge Plan:  IP Rehab Facility  In-House Referral:     Discharge planning Services  CM Consult  Post Acute Care Choice:    Choice offered to:     DME Arranged:    DME Agency:     HH Arranged:    HH Agency:     Status of Service:  In process, will continue to follow  Medicare Important Message Given:    Date Medicare IM Given:    Medicare IM give by:    Date Additional Medicare IM Given:    Additional Medicare Important Message give by:     If discussed at Long Length of Stay Meetings, dates discussed:    Additional Comments:  Quintella BatonJulie W. Cephus Tupy, RN, BSN  Trauma/Neuro ICU Case Manager [phone]

## 2015-06-11 NOTE — Progress Notes (Signed)
Echocardiogram 2D Echocardiogram has been performed.  Nolon RodBrown, Tony 06/11/2015, 11:18 AM

## 2015-06-12 DIAGNOSIS — I610 Nontraumatic intracerebral hemorrhage in hemisphere, subcortical: Secondary | ICD-10-CM

## 2015-06-12 LAB — LIPID PANEL
CHOLESTEROL: 146 mg/dL (ref 0–200)
HDL: 33 mg/dL — ABNORMAL LOW (ref >40)
LDL Cholesterol: 96 mg/dL (ref 0–99)
Total CHOL/HDL Ratio: 4.4 RATIO
Triglycerides: 87 mg/dL (ref <150)
VLDL: 17 mg/dL (ref 0–40)

## 2015-06-12 LAB — GLUCOSE, CAPILLARY
GLUCOSE-CAPILLARY: 112 mg/dL — AB (ref 65–99)
GLUCOSE-CAPILLARY: 106 mg/dL — AB (ref 65–99)
GLUCOSE-CAPILLARY: 98 mg/dL (ref 65–99)
Glucose-Capillary: 102 mg/dL — ABNORMAL HIGH (ref 65–99)

## 2015-06-12 LAB — BASIC METABOLIC PANEL
ANION GAP: 7 (ref 5–15)
BUN: 12 mg/dL (ref 6–20)
CHLORIDE: 108 mmol/L (ref 101–111)
CO2: 23 mmol/L (ref 22–32)
CREATININE: 0.82 mg/dL (ref 0.61–1.24)
Calcium: 8.7 mg/dL — ABNORMAL LOW (ref 8.9–10.3)
GFR calc non Af Amer: >60 mL/min (ref >60)
Glucose, Bld: 105 mg/dL — ABNORMAL HIGH (ref 65–99)
POTASSIUM: 3.5 mmol/L (ref 3.5–5.1)
SODIUM: 138 mmol/L (ref 135–145)

## 2015-06-12 MED ORDER — ADULT MULTIVITAMIN W/MINERALS CH
1.0000 | ORAL_TABLET | Freq: Every day | ORAL | Status: DC
  Administered 2015-06-13: 1 via ORAL
  Filled 2015-06-12: qty 1

## 2015-06-12 MED ORDER — PANTOPRAZOLE SODIUM 40 MG PO TBEC: 40.0000 mg | DELAYED_RELEASE_TABLET | Freq: Every day | ORAL | Status: DC

## 2015-06-12 MED ORDER — MONTELUKAST SODIUM 10 MG PO TABS
10.0000 mg | ORAL_TABLET | Freq: Every day | ORAL | Status: DC
  Administered 2015-06-13: 10 mg via ORAL
  Filled 2015-06-12: qty 1

## 2015-06-12 MED ORDER — ATENOLOL 50 MG PO TABS
50.0000 mg | ORAL_TABLET | Freq: Every day | ORAL | Status: DC
  Administered 2015-06-12 – 2015-06-13 (×2): 50 mg via ORAL
  Filled 2015-06-12 (×2): qty 1

## 2015-06-12 MED ORDER — PANTOPRAZOLE SODIUM 40 MG PO TBEC
40.0000 mg | DELAYED_RELEASE_TABLET | Freq: Every day | ORAL | Status: DC
  Administered 2015-06-12 – 2015-06-13 (×2): 40 mg via ORAL
  Filled 2015-06-12 (×2): qty 1

## 2015-06-12 NOTE — Progress Notes (Signed)
Met with pt and spouse to discuss dc plans.  They currently have homes in both [place], [place] and [place], [place].  Pt states MD has advised them not to fly for about a week after dc, and that they can hold on PT/OT until they return to [place].  Outpatient PT/OT recommended by therapists.  Pt will need RX for OP PT/OT written out and given to them at dc, so they can take to [place] and start therapy at desired rehab center.  Both pt and wife verbalize understanding of this.  Will follow progress.  Reinaldo Raddle, RN, BSN  Trauma/Neuro ICU Case Manager [phone]

## 2015-06-12 NOTE — Progress Notes (Signed)
Physical Therapy Treatment Patient Details Name: Todd Hoffman MRN: [id number] DOB: Sep 15, 1935 Today's Date: 06/12/2015    History of Present Illness pt presents with R Parietal and Occipital lobe ICH.  pt with hx of HTN.      PT Comments    Pt continues to have a poor awareness of his cognitive deficits and when attempting to discuss deficits he is quick to come up with an excuse as to why he might have had trouble.  Pt is impulsive and has difficulty following 2 step directions in room and when out in hallways.  Attempted pathfinding in hallways with pt trying to read signage.  Pt is able to read signage, but then has difficulty following directions he just read to PT.  Pt multiple times walking past room or elevators he was supposed to be looking for and needs Mod to Max cueing for finding directions.  RN attempted to call wife multiple times while PT was present and unable to reach wife.  Continue to feel pt will need 24/7 S for safety and OPPT f/u.  Will continue to follow while on acute.    Follow Up Recommendations  Outpatient PT;Supervision for mobility/OOB     Equipment Recommendations  None recommended by PT    Recommendations for Other Services       Precautions / Restrictions Precautions Precautions: Fall Precaution Comments: Impulsive Restrictions Weight Bearing Restrictions: No    Mobility  Bed Mobility Overal bed mobility: Needs Assistance Bed Mobility: Supine to Sit;Sit to Supine     Supine to sit: Supervision Sit to supine: Supervision   General bed mobility comments: Able to perform without A, however impulsive and setting off bed alarm.    Transfers Overall transfer level: Needs assistance Equipment used: None Transfers: Sit to/from Stand (Floor transfer) Sit to Stand: Supervision         General transfer comment: pt continues to be very impulsive and not attending to lines prior to mobility.  pt instructed to look for his cell phone on his bed,  but instead lowers himself to the floor and looks under the bed for his cell phone and then brings himself back up to standing.    Ambulation/Gait Ambulation/Gait assistance: Supervision Ambulation Distance (Feet): 1000 Feet Assistive device: None Gait Pattern/deviations: Step-through pattern;Decreased stride length     General Gait Details: close S for safety as pt impulsive and when attempting cognitive task has increased difficulty with dynamic balance.  Ambulated around 3rd floor working on pathfinding and cognitive tasks while ambulating.      Stairs            Wheelchair Mobility    Modified Rankin (Stroke Patients Only) Modified Rankin (Stroke Patients Only) Pre-Morbid Rankin Score: No symptoms Modified Rankin: Moderately severe disability     Balance Overall balance assessment: Needs assistance         Standing balance support: No upper extremity supported;During functional activity Standing balance-Leahy Scale: Good Standing balance comment: Mild side steps noted when attempting cognitive tasks during ambulation.                      Cognition Arousal/Alertness: Awake/alert Behavior During Therapy: Impulsive Overall Cognitive Status: Impaired/Different from baseline Area of Impairment: Attention;Memory;Following commands;Safety/judgement;Awareness;Problem solving   Current Attention Level: Alternating Memory: Decreased short-term memory Following Commands: Follows one step commands consistently;Follows multi-step commands inconsistently Safety/Judgement: Decreased awareness of safety;Decreased awareness of deficits Awareness: Emergent Problem Solving: Difficulty sequencing;Requires verbal cues;Requires tactile cues General Comments: pt  continues to be impulsive and needs consistent cues for safety and attending to task.  pt attempted to pathfind in hallway and was having difficulty following signage.  pt walked past rooms and elevators he was  supposed to be looking for.  Mod to Max cueing for understanding of signage and finding targets.  When educating pt on cognitive deficits he is quick to come up with an excuse for deficit.      Exercises      General Comments        Pertinent Vitals/Pain Pain Assessment: No/denies pain    Home Living                      Prior Function            PT Goals (current goals can now be found in the care plan section) Acute Rehab PT Goals Patient Stated Goal: Home PT Goal Formulation: With patient Time For Goal Achievement: 06/25/15 Potential to Achieve Goals: Good Progress towards PT goals: Progressing toward goals    Frequency  Min 4X/week    PT Plan Current plan remains appropriate    Co-evaluation             End of Session Equipment Utilized During Treatment: Gait belt Activity Tolerance: Patient tolerated treatment well Patient left: in bed;with call bell/phone within reach;with bed alarm set     Time: [id number] PT Time Calculation (min) (ACUTE ONLY): 28 min  Charges:  $Gait Training: 23-37 mins                    G CodesSunny Schlein:      Yue Flanigan F, [place]PT [phone] 06/12/2015, 2:12 PM

## 2015-06-12 NOTE — Progress Notes (Addendum)
STROKE TEAM PROGRESS NOTE   HISTORY Todd Hoffman is an 79 y.o. male with known HTN. Patient was last seen normal last night (LKW 06/09/2015, unable to determine time). This AM 06/10/2015 his wife noted he was putting his pants on backwards and not acting normal. [place] went to the airport to catch a flight and he was found down in the bathroom. He has no recollection of this. EMS noted some left sided ataxia and called a code stroke. CT was noted to show an acute hemorrhage right parietal lobe with surrounding edema and a small amount of subarachnoid hemorrhage with differential diagnosis that includes amyloid, trauma, vascular malformation and possibly underlying tumor. Blood volume: 3.5 cc ICH score: 0. He was admitted to the neuro ICU for further evaluation and treatment.   SUBJECTIVE (INTERVAL HISTORY) wife is at the bedside.  Overall he feels his condition is improving. I spoke to patient's daughter is and answered questions about his condition. His blood pressure has been adequately controlled. He has remained neurologically stable. OBJECTIVE Temp:  [97.4 F (36.3 C)-98.5 F (36.9 C)] 98.1 F (36.7 C) (12/14 1600) Pulse Rate:  [37-81] 62 (12/14 1843) Cardiac Rhythm:  [-] Normal sinus rhythm (12/14 0800) Resp:  [12-25] 20 (12/14 1843) BP: (119-189)/(63-94) 189/92 mmHg (12/14 1843) SpO2:  [94 %-99 %] 98 % (12/14 1843)  CBC:   Recent Labs Lab 06/10/15 1442 06/10/15 1448 06/10/15 2030  WBC 8.0  --  7.0  NEUTROABS 5.5  --   --   HGB 14.2 15.0 13.6  HCT 41.5 44.0 39.4  MCV 92.8  --  91.6  PLT 131*  --  122*    Basic Metabolic Panel:   Recent Labs Lab 06/10/15 2030 06/12/15 0317  NA 137 138  K 3.4* 3.5  CL 106 108  CO2 23 23  GLUCOSE 113* 105*  BUN 11 12  CREATININE 0.71 0.82  CALCIUM 9.0 8.7*    Lipid Panel:     Component Value Date/Time   CHOL 146 06/12/2015 0317   TRIG 87 06/12/2015 0317   HDL 33* 06/12/2015 0317   CHOLHDL 4.4 06/12/2015 0317   VLDL 17  06/12/2015 0317   LDLCALC 96 06/12/2015 0317   HgbA1c: No results found for: HGBA1C Urine Drug Screen: No results found for: LABOPIA, COCAINSCRNUR, LABBENZ, AMPHETMU, THCU, LABBARB    IMAGING  Ct Head Wo Contrast 06/11/2015   1. Stable size of right occipital 26 x 16 mm with parenchymal hemorrhage with mild localized vasogenic edema. 2. Slight interval decrease in adjacent small volume acute subarachnoid hemorrhage within the right parietal lobe. 3. No other new intracranial process.   Ct Head Wo Contrast 06/10/2015  25 x 16 mm acute hemorrhage right parietal lobe with surrounding edema and a small amount of subarachnoid hemorrhage. Differential diagnosis includes amyloid, trauma, vascular malformation and possibly underlying tumor. No definite tumor mass is identified.   MRI HEAD  06/10/2015  1. 26 x 14 x 18 mm acute parenchymal hemorrhage within the right occipital lobe with mild localized vasogenic edema and small amount of adjacent subarachnoid hemorrhage. Primary differential considerations include possible trauma or amyloid angiopathy. No definite underlying mass lesion on this noncontrast examination. No underlying vascular malformation. 2. Moderate chronic microvascular ischemic disease.   MRA HEAD  06/10/2015  1. Short segment severe stenosis within the mid left P2 segment. 2. Otherwise negative intracranial MRA.      PHYSICAL EXAM Pleasant elderly Hispanic male not in distress. . Afebrile. Head is nontraumatic. Neck  is supple without bruit.    Cardiac exam no murmur or gallop. Lungs are clear to auscultation. Distal pulses are well felt. Neurological Exam :  Awake alert oriented to time place and person. Speech is fluent without dysarthria or aphagia. Extraocular movements are full range without nystagmus. Fundi were not visualized. Vision acuity seems adequate. Partial left homonymous hemianopsia to bedside confrontational testing. Pupils equal reactive. Face appears symmetric  with only mild minimal left lower facial asymmetry on smiling. Tongue midline. Motor system exam revealed no upper or lower extremity drift but mild weakness of left grip and intrinsic hand muscles. Orbits right over left upper extremity. Symmetric lower extremity strength. Deep tendon reflexes are 2+ symmetric. Plantars are downgoing. Touch and pinprick sensation are preserved bilaterally. Coordination is intact. Gait was not tested. ASSESSMENT/PLAN Mr. Todd Hoffman is a 79 y.o. male with history of hypertension found down in the airport bathroom with left-sided ataxia, confusion prior to. Pt reports straining with BM at symptom onset. CT showed a right parietal hemorrhage.  Right parietal/occipital lobe hemorrhage with surrounding cerebral edema and small SAH hemorrhage, most likely hemorrhagic infarct vs hemorrhage due to amyloid. Doubt underlying tumor or hypertensive etiology at this time  Good memory, able to recall 3 objects in 3 minutes  Initial CT with right parietal/occipital lobe hemorrhage With edema and subarachnoid hemorrhage. No tumor identified.  Repeat CT with right parietal/occipital intraparenchymal hemorrhage with localized edema and slight decrease in subarachnoid hemorrhage in the right parietal  MRI  right parietal/occipital lobe hemorrhage with localized cerebral edema and adjacent subarachnoid hemorrhage. No tumor seen  MRA  Left P2 mid segment with severe stenosis, otherwise unremarkable  Carotid Doppler  pending   2D Echo  Ordered  HgbA1c pending  Lipids pending   SCDs for VTE prophylaxis Diet Heart Room service appropriate?: Yes; Fluid consistency:: Thin  No antithrombotic prior to admission  Ongoing aggressive stroke risk factor management  Therapy recommendations:  Pending. Ok to be OOB  Continue ICU level care x 1 day  Disposition:  pending   Hypertension  BP 152/90 on arrival  Had been started on cardene, now off  Remains Stable  At SBP  goal < 140  Other Stroke Risk Factors  Advanced age  ETOH use  Other Active Problems  Thrombocytopenia, PLT 122  Hypokalemia, K 3.4 replace with Kdur 20 meq x 2  Trigeminal neuralgia  Follow[place] by Dr. Noel Gerold in [place]  On  Gabapentin at home, 300 tid daily  Still complains of pain  Will increase to 400 tid  Constipation  Chronic  colonscopy done PTA within past 2 years, no abnormal findings per pt  Hospital day # 2   I have personally examined this patient, reviewed notes, independently viewed imaging studies, participated in medical decision making and plan of care. I have made any additions or clarifications directly to the above note.  Marland Kitchen He is neurologically stable to be transferred out of ICU today. We will follow recommendations from physical occupational speech therapy. They're concerned about his cognitive abilities, judgment and impulse 8 see impulsivity acuity. Patient wants to go home and try to [place] but he may need to hold off and be able to be managed by his wife says stable from the cystoscopy happened. I had a long telephonic discussion with his daughter in [place] as well as his wife at the bedside about this and answered questions. Delia Heady, MD Medical Director [facility] Pager: [phone] 06/12/2015  942 AM    To contact Stroke Continuity provider, please refer to [email or web address]. After hours, contact General Neurology

## 2015-06-12 NOTE — Progress Notes (Signed)
NURSING PROGRESS NOTE  Todd LivingsRobert Hoffman [id number] Transfer Data: 06/12/2015 6:42 PM Attending Provider: Micki RileyPramod S Sethi, MD PCP:No primary care provider on file. Code Status: Full  Todd LivingsRobert Hoffman is a 79 y.o. male patient transferred from 8M -No acute distress noted.  -No complaints of shortness of breath.  -No complaints of chest pain.    Blood pressure 150/85, pulse 58, temperature 98.1 F (36.7 C), temperature source Oral, resp. rate 13, height 5\' 7"  (1.702 m), weight 79.7 kg (175 lb 11.3 oz), SpO2 99 %.   IV Fluids:  IV in place, SL  Allergies:  Review of patient's allergies indicates no known allergies.  Past Medical History:   has a past medical history of Hypertension and Pinched nerve.  Past Surgical History:   has no past surgical history on file.  Social History:   reports that he has quit smoking. He has never used smokeless tobacco. He reports that he drinks alcohol. He reports that he does not use illicit drugs.  Skin: Intact  Patient/Family orientated to room. Information packet given to patient/family. Admission inpatient armband information verified with patient/family to include name and date of birth and placed on patient arm. Side rails up x 2, fall assessment and education completed with patient/family. Patient/family able to verbalize understanding of risk associated with falls and verbalized understanding to call for assistance before getting out of bed. Call light within reach. Patient/family able to voice and demonstrate understanding of unit orientation instructions.    Will continue to evaluate and treat per MD orders.

## 2015-06-12 NOTE — Progress Notes (Signed)
  Wife providing the following numbers to staff:  Local phone number to house: [phone] Cell phone listed in chart as primary contact information  [place] home number: [phone] In the event the wife can not be contacted in an emergency daughter number in [place] [phone]   Mateo FlowJones, Brynn   OTR/L Pager: [phone] Office: [phone] .late entry

## 2015-06-13 DIAGNOSIS — I619 Nontraumatic intracerebral hemorrhage, unspecified: Secondary | ICD-10-CM | POA: Insufficient documentation

## 2015-06-13 LAB — GLUCOSE, CAPILLARY
GLUCOSE-CAPILLARY: 103 mg/dL — AB (ref 65–99)
GLUCOSE-CAPILLARY: 104 mg/dL — AB (ref 65–99)
GLUCOSE-CAPILLARY: 112 mg/dL — AB (ref 65–99)
Glucose-Capillary: 96 mg/dL (ref 65–99)

## 2015-06-13 LAB — HEMOGLOBIN A1C
HEMOGLOBIN A1C: 5.5 % (ref 4.8–5.6)
MEAN PLASMA GLUCOSE: 111 mg/dL

## 2015-06-13 MED ORDER — ACETAMINOPHEN 325 MG PO TABS: 650.0000 mg | ORAL_TABLET | Freq: Four times a day (QID) | ORAL | Status: AC | PRN

## 2015-06-13 NOTE — Progress Notes (Signed)
STROKE TEAM PROGRESS NOTE   HISTORY Todd Hoffman is an 79 y.o. male with known HTN. Patient was last seen normal last night (LKW 06/09/2015, unable to determine time). This AM 06/10/2015 his wife noted he was putting his pants on backwards and not acting normal. They went to the airport to catch a flight and he was found down in the bathroom. He has no recollection of this. EMS noted some left sided ataxia and called a code stroke. CT was noted to show an acute hemorrhage right parietal lobe with surrounding edema and a small amount of subarachnoid hemorrhage with differential diagnosis that includes amyloid, trauma, vascular malformation and possibly underlying tumor. Blood volume: 3.5 cc ICH score: 0. He was admitted to the neuro ICU for further evaluation and treatment.   SUBJECTIVE (INTERVAL HISTORY) wife is at the bedside.  Overall he feels his condition is improving. I spoke to patient's daughter is and answered questions about his condition. His blood pressure has been adequately controlled. He has remained neurologically stable.    OBJECTIVE Temp:  [97.9 F (36.6 C)-98.6 F (37 C)] 98.6 F (37 C) (12/15 1418) Pulse Rate:  [58-67] 62 (12/15 1418) Cardiac Rhythm:  [-]  Resp:  [13-22] 19 (12/15 1418) BP: (133-189)/(56-92) 176/92 mmHg (12/15 1418) SpO2:  [98 %-100 %] 99 % (12/15 1418)  CBC:   Recent Labs Lab 06/10/15 1442 06/10/15 1448 06/10/15 2030  WBC 8.0  --  7.0  NEUTROABS 5.5  --   --   HGB 14.2 15.0 13.6  HCT 41.5 44.0 39.4  MCV 92.8  --  91.6  PLT 131*  --  122*    Basic Metabolic Panel:   Recent Labs Lab 06/10/15 2030 06/12/15 0317  NA 137 138  K 3.4* 3.5  CL 106 108  CO2 23 23  GLUCOSE 113* 105*  BUN 11 12  CREATININE 0.71 0.82  CALCIUM 9.0 8.7*    Lipid Panel:     Component Value Date/Time   CHOL 146 06/12/2015 0317   TRIG 87 06/12/2015 0317   HDL 33* 06/12/2015 0317   CHOLHDL 4.4 06/12/2015 0317   VLDL 17 06/12/2015 0317   LDLCALC 96  06/12/2015 0317   HgbA1c:  Lab Results  Component Value Date   HGBA1C 5.5 06/12/2015   Urine Drug Screen: No results found for: LABOPIA, COCAINSCRNUR, LABBENZ, AMPHETMU, THCU, LABBARB    IMAGING  Ct Head Wo Contrast 06/11/2015   1. Stable size of right occipital 26 x 16 mm with parenchymal hemorrhage with mild localized vasogenic edema. 2. Slight interval decrease in adjacent small volume acute subarachnoid hemorrhage within the right parietal lobe. 3. No other new intracranial process.   Ct Head Wo Contrast 06/10/2015  25 x 16 mm acute hemorrhage right parietal lobe with surrounding edema and a small amount of subarachnoid hemorrhage. Differential diagnosis includes amyloid, trauma, vascular malformation and possibly underlying tumor. No definite tumor mass is identified.   MRI HEAD  06/10/2015   1. 26 x 14 x 18 mm acute parenchymal hemorrhage within the right occipital lobe with mild localized vasogenic edema and small amount of adjacent subarachnoid hemorrhage. Primary differential considerations include possible trauma or amyloid angiopathy. No definite underlying mass lesion on this noncontrast examination. No underlying vascular malformation.  2. Moderate chronic microvascular ischemic disease.   MRA HEAD  06/10/2015  1. Short segment severe stenosis within the mid left P2 segment. 2. Otherwise negative intracranial MRA.      PHYSICAL EXAM Pleasant elderly Hispanic  male not in distress. . Afebrile. Head is nontraumatic. Neck is supple without bruit.    Cardiac exam no murmur or gallop. Lungs are clear to auscultation. Distal pulses are well felt. Neurological Exam :  Awake alert oriented to time place and person. Speech is fluent without dysarthria or aphagia. Extraocular movements are full range without nystagmus. Fundi were not visualized. Vision acuity seems adequate. Partial left homonymous hemianopsia to bedside confrontational testing. Pupils equal reactive. Face appears  symmetric with only mild minimal left lower facial asymmetry on smiling. Tongue midline. Motor system exam revealed no upper or lower extremity drift but mild weakness of left grip and intrinsic hand muscles. Orbits right over left upper extremity. Symmetric lower extremity strength. Deep tendon reflexes are 2+ symmetric. Plantars are downgoing. Touch and pinprick sensation are preserved bilaterally. Coordination is intact. Gait was not tested.     ASSESSMENT/PLAN Todd Hoffman is a 79 y.o. male with history of hypertension found down in the airport bathroom with left-sided ataxia, confusion prior to. Pt reports straining with BM at symptom onset. CT showed a right parietal hemorrhage.  Right parietal/occipital lobe hemorrhage with surrounding cerebral edema and small SAH hemorrhage, most likely hemorrhagic infarct vs hemorrhage due to amyloid. Doubt underlying tumor or hypertensive etiology at this time  Good memory, able to recall 3 objects in 3 minutes  Initial CT with right parietal/occipital lobe hemorrhage With edema and subarachnoid hemorrhage. No tumor identified.  Repeat CT with right parietal/occipital intraparenchymal hemorrhage with localized edema and slight decrease in subarachnoid hemorrhage in the right parietal  MRI  right parietal/occipital lobe hemorrhage with localized cerebral edema and adjacent subarachnoid hemorrhage. No tumor seen  MRA  Left P2 mid segment with severe stenosis, otherwise unremarkable  Carotid Doppler - 1-39% internal carotid artery stenosis bilaterally. Vertebral arteries are patent with antegrade flow bilaterally. The left vertebral artery exhibits an atypical waveform; etiology unknown.  HgbA1c - 5.5  2D Echo  EF 55-60%. Mildly dilated aortic root at sinuses of Valsalva in the ascending aorta.  LDL - 96  SCDs for VTE prophylaxis Diet Heart Room service appropriate?: Yes; Fluid consistency:: Thin  No antithrombotic prior to  admission  Ongoing aggressive stroke risk factor management  Therapy recommendations:  Pending. Ok to be OOB  Continue ICU level care x 1 day  Disposition:  Discharge  Hypertension  BP 152/90 on arrival  Had been started on cardene, now off  Remains Stable  At SBP goal < 140  Other Stroke Risk Factors  Advanced age  ETOH use  Other Active Problems  Thrombocytopenia, PLT 122  Hypokalemia, K 3.4 replace with Kdur 20 meq x 2  Trigeminal neuralgia  [place]  On  Gabapentin at home, 300 tid daily  Still complains of pain  Will increase to 400 tid  Constipation  Chronic  colonscopy done PTA within past 2 years, no abnormal findings per pt  Hospital day # 3   Delton Seeavid Rinehuls PA-C [facility] Pager [phone] 06/13/2015, 2:35 PM    I have personally examined this patient, reviewed notes, independently viewed imaging studies, participated in medical decision making and plan of care. I have made any additions or clarifications directly to the above note.  Marland Kitchen. He is neurologically stable to be discharged home today.  Advised to follow-up with primary physician in [place] and have outpatient therapy arranged. Patient advised not to travel for at least one week. Delia HeadyPramod Athziry Millican, MD  To contact Stroke Continuity provider, please refer to [email or web address] After hours, contact General Neurology

## 2015-06-13 NOTE — Care Management Important Message (Signed)
Important Message  Patient Details  Name: Todd LivingsRobert Pellerito MRN: [id number] Date of Birth: 1935-07-23   Medicare Important Message Given:  Yes    Rayvon CharSTUTTS, Courtez Twaddle G 06/13/2015, 2:43 PMImportant Message  Patient Details  Name: Todd LivingsRobert Spidle MRN: [id number] Date of Birth: 1935-07-23   Medicare Important Message Given:  Yes    Nabiha Planck G 06/13/2015, 2:43 PM

## 2015-06-13 NOTE — Progress Notes (Addendum)
Physical Therapy Treatment Patient Details Name: Todd LivingsRobert Cuartas MRN: [id number] DOB: December 06, 1935 Today's Date: 06/13/2015    History of Present Illness pt presents with R Parietal and Occipital lobe ICH.  pt with hx of HTN.      PT Comments    Pt with good mobility but continues to demonstrate cognitive deficits. Wife present for treatment. Improved pathfinding. Pt needed verbal cues to come up with 911 as the number he would call if there was a fire. Wife educated on deficits. 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 Advised pt and wife that pt should work with OP PT/OT in [place] toward that goal.  Follow Up Recommendations  Outpatient PT;Supervision for mobility/OOB     Equipment Recommendations  None recommended by PT    Recommendations for Other Services       Precautions / Restrictions Precautions Precautions: Fall Precaution Comments: Impulsive Restrictions Weight Bearing Restrictions: No    Mobility  Bed Mobility Overal bed mobility: Modified Independent Bed Mobility: Supine to Sit;Sit to Supine     Supine to sit: Modified independent (Device/Increase time) Sit to supine: Modified independent (Device/Increase time)   General bed mobility comments: Remains impulsive.  Transfers Overall transfer level: Needs assistance Equipment used: None Transfers: Sit to/from Stand Sit to Stand: Supervision         General transfer comment: cues to slow down  Ambulation/Gait Ambulation/Gait assistance: Supervision Ambulation Distance (Feet): 1000 Feet Assistive device: None Gait Pattern/deviations: WFL(Within Functional Limits)   Gait velocity interpretation: at or above normal speed for age/gender General Gait Details: supervision due to cognitive deficits. Worked on pathfinding and cognitive tasks while amb. Pt without any loss of balance.   Stairs Stairs: Yes Stairs assistance: Supervision Stair Management: One rail  Right;Alternating pattern;Forwards Number of Stairs: 12 General stair comments: supervision due to cognition.  Wheelchair Mobility    Modified Rankin (Stroke Patients Only) Modified Rankin (Stroke Patients Only) Pre-Morbid Rankin Score: No symptoms Modified Rankin: Moderately severe disability     Balance     Sitting balance-Leahy Scale: Normal       Standing balance-Leahy Scale: Normal                      Cognition Arousal/Alertness: Awake/alert Behavior During Therapy: Impulsive Overall Cognitive Status: Impaired/Different from baseline Area of Impairment: Attention;Memory;Following commands;Safety/judgement;Awareness;Problem solving   Current Attention Level: Alternating Memory: Decreased short-term memory Following Commands: Follows one step commands consistently;Follows multi-step commands inconsistently Safety/Judgement: Decreased awareness of safety;Decreased awareness of deficits Awareness: Emergent Problem Solving: Difficulty sequencing;Requires verbal cues;Requires tactile cues General Comments: Pt with some improvement with pathfinding and following multistep commands but continues to be below his baseline. With pathfinding pt initially bypassed room but did catch himself and was able to find room.     Exercises      General Comments        Pertinent Vitals/Pain      Home Living                      Prior Function            PT Goals (current goals can now be found in the care plan section) Progress towards PT goals: Progressing toward goals    Frequency  Min 4X/week    PT Plan Current plan remains appropriate    Co-evaluation             End of Session   Activity  Tolerance: Patient tolerated treatment well Patient left: in bed;with call bell/phone within reach;with bed alarm set;with family/visitor present     Time: 1140-1158 PT Time Calculation (min) (ACUTE ONLY): 18 min  Charges:  $Gait Training: 8-22  mins                    G Codes:      Katalena Malveaux 07/07/15, 12:22 PM [employer] PT [phone]

## 2015-06-13 NOTE — Discharge Instructions (Signed)
1. Make an appointment to see your neurologist in 1-2 weeks. 2. Make an appointment to see your primary care physician in one to 2 weeks. 3. No driving until cleared by a physician or a licensed physical or occupational therapist. 4. Outpatient physical therapy, occupational therapy, and speech therapy recommended. 5. Records from this hospitalization should be obtained by your physicians for further follow-up. 6. Nothing strenuous until cleared by your physician 7. No aspirin or aspirin products until cleared by your neurologist

## 2015-06-13 NOTE — Care Management Note (Addendum)
Case Management Note  Patient Details  Name: Lemar LivingsRobert Cheramie MRN: [id number] Date of Birth: [birth year]  Subjective/Objective:     Date:06/13/15 Spoke with patient at the bedside along with wife.  Introduced self as Sports coachcase manager and explained role in discharge planning and how to be reached.  [place], [place]. Expressed potential need for no other DME.  Verified patient anticipates to go home with family,  at time of discharge and will have full-time supervision by family at this time to best of their knowledge.  Patient  denied needing help with their medication.  Patient drives  to MD appointments.  Verified patient [place], they plan to fly back by Tuesday. Patient will need script from MD for outpatient pt/ot and st.  Plan: CM will continue to follow [facility] and [facility].                Action/Plan:   Expected Discharge Date:                  Expected Discharge Plan:  Home/Self Care  In-House Referral:     Discharge planning Services  CM Consult  Post Acute Care Choice:    Choice offered to:     DME Arranged:    DME Agency:     HH Arranged:    HH Agency:     Status of Service:  Completed, signed off  Medicare Important Message Given:    Date Medicare IM Given:    Medicare IM give by:    Date Additional Medicare IM Given:    Additional Medicare Important Message give by:     If discussed at Long Length of Stay Meetings, dates discussed:    Additional Comments:  Leone Havenaylor, Trinty Marken Clinton, RN 06/13/2015, 12:10 PM

## 2015-06-13 NOTE — Progress Notes (Signed)
Speech Language Pathology Treatment: Cognitive-Linquistic  Patient Details Name: Todd Hoffman MRN: [id number] DOB: 31-Dec-1935 Today's Date: 06/13/2015 Time: 1325-1400 SLP Time Calculation (min) (ACUTE ONLY): 35 min  Assessment / Plan / Recommendation Clinical Impression  Pt was seen for skilled ST targeting cognitive goals and family education.  Pt's wife was present at bedside and SLP reviewed and reinforced results of cognitive test.  Pt required extra time and mod assist question cues to identify at least 1 deficit occurring s/p CVA.  SLP provided skilled education regarding distraction management techniques in order to maximize sustained attention and safety during functional tasks.  SLP also provided skilled instruction regarding cognitive hierarchy and sustained attention's impact on all higher level cognitive processes.  SLP strongly recommended that pt's wife provide assistance for medication and financial management due to cognitive deficits.  Both pt and wife are in agreement.  Pt and wife also both aware that pt will need 24/7 supervision and ST follow up at next level of care to continue to address cognition.  CSW made aware of follow up recommendations.  All questions were answered to pt and family's satisfaction at this time.     HPI HPI: pt presents with R Parietal and Occipital lobe ICH. pt with hx of HTN.       SLP Plan  Continue with current plan of care     Recommendations                 Follow up Recommendations: Outpatient SLP;24 hour supervision/assistance Plan: Continue with current plan of care   Axelle Szwed, Melanee SpryNicole L 06/13/2015, 2:09 PM

## 2015-06-13 NOTE — Progress Notes (Signed)
Lemar Livingsobert Khurana to be D/C'd Home per MD order. Discussed with the patient and all questions fully answered.    Medication List    STOP taking these medications        aspirin 325 MG tablet      TAKE these medications        acetaminophen 325 MG tablet  Commonly known as:  TYLENOL  Take 2 tablets (650 mg total) by mouth every 6 (six) hours as needed for mild pain (or temp > 99 F).     atenolol 50 MG tablet  Commonly known as:  TENORMIN  Take 50 mg by mouth daily.     gabapentin 300 MG capsule  Commonly known as:  NEURONTIN  Take 300 mg by mouth 3 (three) times daily.     montelukast 10 MG tablet  Commonly known as:  SINGULAIR  Take 10 mg by mouth daily.     multivitamin with minerals Tabs tablet  Take 1 tablet by mouth daily.     omeprazole 20 MG capsule  Commonly known as:  PRILOSEC  Take 20 mg by mouth daily.        VVS, Skin clean, dry and intact without evidence of skin break down, no evidence of skin tears noted.  IV catheter discontinued intact. Site without signs and symptoms of complications. Dressing and pressure applied.  An After Visit Summary was printed and given to the patient.  Patient escorted via WC, and D/C home via private auto.  Beckey DowningFlores, Quayshaun Hubbert F  06/13/2015 5:52 PM

## 2015-06-30 NOTE — Discharge Summary (Signed)
Stroke Discharge Summary  Patient ID: Todd Hoffman   MRN: [id number]      DOB: Sep 18, 1935  Date of Admission: 06/10/2015 Date of Discharge: 06/30/2015  Attending Physician:  No att. providers found, Stroke MD  Consulting Physician(s):     None  Patient's PCP:  No primary care provider on file.  DISCHARGE DIAGNOSIS: Acute parenchymal hemorrhage within the right occipital lobe with vasogenic edema. Active Problems:   ICH (intracerebral hemorrhage) (HCC)   Intraparenchymal hemorrhage of brain (HCC)  BMI: Body mass index is 27.51 kg/(m^2).  Past Medical History  Diagnosis Date  . Hypertension   . Pinched nerve     R facial nerve pain   History reviewed. No pertinent past surgical history.    Medication List    STOP taking these medications        aspirin 325 MG tablet      TAKE these medications        acetaminophen 325 MG tablet  Commonly known as:  TYLENOL  Take 2 tablets (650 mg total) by mouth every 6 (six) hours as needed for mild pain (or temp > 99 F).     atenolol 50 MG tablet  Commonly known as:  TENORMIN  Take 50 mg by mouth daily.     gabapentin 300 MG capsule  Commonly known as:  NEURONTIN  Take 300 mg by mouth 3 (three) times daily.     montelukast 10 MG tablet  Commonly known as:  SINGULAIR  Take 10 mg by mouth daily.     multivitamin with minerals Tabs tablet  Take 1 tablet by mouth daily.     omeprazole 20 MG capsule  Commonly known as:  PRILOSEC  Take 20 mg by mouth daily.        LABORATORY STUDIES CBC    Component Value Date/Time   WBC 7.0 06/10/2015 2030   RBC 4.30 06/10/2015 2030   HGB 13.6 06/10/2015 2030   HCT 39.4 06/10/2015 2030   PLT 122* 06/10/2015 2030   MCV 91.6 06/10/2015 2030   MCH 31.6 06/10/2015 2030   MCHC 34.5 06/10/2015 2030   RDW 12.8 06/10/2015 2030   LYMPHSABS 1.9 06/10/2015 1442   MONOABS 0.5 06/10/2015 1442   EOSABS 0.1 06/10/2015 1442   BASOSABS 0.0 06/10/2015 1442   CMP    Component Value  Date/Time   NA 138 06/12/2015 0317   K 3.5 06/12/2015 0317   CL 108 06/12/2015 0317   CO2 23 06/12/2015 0317   GLUCOSE 105* 06/12/2015 0317   BUN 12 06/12/2015 0317   CREATININE 0.82 06/12/2015 0317   CALCIUM 8.7* 06/12/2015 0317   PROT 6.9 06/10/2015 2030   ALBUMIN 3.9 06/10/2015 2030   AST 22 06/10/2015 2030   ALT 18 06/10/2015 2030   ALKPHOS 53 06/10/2015 2030   BILITOT 0.9 06/10/2015 2030   GFRNONAA >60 06/12/2015 0317   GFRAA >60 06/12/2015 0317   COAGS Lab Results  Component Value Date   INR 1.18 06/10/2015   INR 1.11 06/10/2015   Lipid Panel    Component Value Date/Time   CHOL 146 06/12/2015 0317   TRIG 87 06/12/2015 0317   HDL 33* 06/12/2015 0317   CHOLHDL 4.4 06/12/2015 0317   VLDL 17 06/12/2015 0317   LDLCALC 96 06/12/2015 0317   HgbA1C  Lab Results  Component Value Date   HGBA1C 5.5 06/12/2015   Cardiac Panel (last 3 results) No results for input(s): CKTOTAL, CKMB, TROPONINI, RELINDX in the  last 72 hours. Urinalysis No results found for: COLORURINE, APPEARANCEUR, LABSPEC, PHURINE, GLUCOSEU, HGBUR, BILIRUBINUR, KETONESUR, PROTEINUR, UROBILINOGEN, NITRITE, LEUKOCYTESUR Urine Drug Screen No results found for: LABOPIA, COCAINSCRNUR, LABBENZ, AMPHETMU, THCU, LABBARB  Alcohol Level No results found for: [facility]   SIGNIFICANT DIAGNOSTIC STUDIES   Ct Head Wo Contrast 06/11/2015 1. Stable size of right occipital 26 x 16 mm with parenchymal hemorrhage with mild localized vasogenic edema. 2. Slight interval decrease in adjacent small volume acute subarachnoid hemorrhage within the right parietal lobe. 3. No other new intracranial process.   Ct Head Wo Contrast 06/10/2015 25 x 16 mm acute hemorrhage right parietal lobe with surrounding edema and a small amount of subarachnoid hemorrhage. Differential diagnosis includes amyloid, trauma, vascular malformation and possibly underlying tumor. No definite tumor mass is identified.   MRI HEAD  06/10/2015  1. 26 x 14  x 18 mm acute parenchymal hemorrhage within the right occipital lobe with mild localized vasogenic edema and small amount of adjacent subarachnoid hemorrhage. Primary differential considerations include possible trauma or amyloid angiopathy. No definite underlying mass lesion on this noncontrast examination. No underlying vascular malformation.  2. Moderate chronic microvascular ischemic disease.   MRA HEAD  06/10/2015 1. Short segment severe stenosis within the mid left P2 segment. 2. Otherwise negative intracranial MRA.     HISTORY OF PRESENT ILLNESS Todd Hoffman is an 80 y.o. male with known HTN. Patient was last seen normal the night prior to admission (LKW 06/09/2015, unable to determine time). On the morning of 06/10/2015 his wife noted he was putting his pants on backwards and not acting normal. [place] went to the airport to catch a flight and he was found down in the bathroom. He had no recollection of this. EMS noted some left sided ataxia and called a code stroke. CT was noted to show an acute hemorrhage right parietal lobe with surrounding edema and a small amount of subarachnoid hemorrhage with differential diagnosis that includes amyloid, trauma, vascular malformation and possibly underlying tumor. Blood volume: 3.5 cc ICH score: 0. He was admitted to the neuro ICU for further evaluation and treatment.  HOSPITAL COURSE   ASSESSMENT/PLAN Todd Hoffman is a 80 y.o. male with history of hypertension found down in the airport bathroom with left-sided ataxia, confusion prior to. Pt reports straining with BM at symptom onset. CT showed a right parietal hemorrhage.  Right parietal/occipital lobe hemorrhage with surrounding cerebral edema and small SAH hemorrhage, most likely hemorrhagic infarct vs hemorrhage due to amyloid. Doubt underlying tumor or hypertensive etiology at this time  Good memory, able to recall 3 objects in 3 minutes  Initial CT with right parietal/occipital lobe  hemorrhage With edema and subarachnoid hemorrhage. No tumor identified.  Repeat CT with right parietal/occipital intraparenchymal hemorrhage with localized edema and slight decrease in subarachnoid hemorrhage in the right parietal  MRI right parietal/occipital lobe hemorrhage with localized cerebral edema and adjacent subarachnoid hemorrhage. No tumor seen  MRA Left P2 mid segment with severe stenosis, otherwise unremarkable  Carotid Doppler - 1-39% internal carotid artery stenosis bilaterally. Vertebral arteries are patent with antegrade flow bilaterally. The left vertebral artery exhibits an atypical waveform; etiology unknown.  HgbA1c - 5.5  2D Echo EF 55-60%. Mildly dilated aortic root at sinuses of Valsalva in the ascending aorta.  LDL - 96  SCDs for VTE prophylaxis  Diet Heart Room service appropriate?: Yes; Fluid consistency:: Thin  No antithrombotic prior to admission  Ongoing aggressive stroke risk factor management  Therapy recommendations: Pending. Ok  to be OOB  Continue ICU level care x 1 day  Disposition: Discharge  Hypertension  BP 152/90 on arrival  Had been started on cardene, now off  Remains Stable  At SBP goal < 140  Other Stroke Risk Factors  Advanced age  ETOH use  Other Active Problems  Thrombocytopenia, PLT 122  Hypokalemia, K 3.4 replace with Kdur 20 meq x 2  Trigeminal neuralgia  [place]  On Gabapentin at home, 300 tid daily  Still complains of pain  Will increase to 400 tid  Constipation  Chronic  colonscopy done PTA within past 2 years, no abnormal findings per pt  DISCHARGE EXAM Blood pressure 176/92, pulse 62, temperature 98.6 F (37 C), temperature source Oral, resp. rate 19, height 5\' 7"  (1.702 m), weight 79.7 kg (175 lb 11.3 oz), SpO2 99 %. Pleasant elderly Hispanic male not in distress. . Afebrile. Head is nontraumatic. Neck is supple without bruit.  Cardiac exam no murmur or gallop. Lungs are clear to auscultation. Distal pulses are well felt. Neurological Exam :  Awake alert oriented to time place and person. Speech is fluent without dysarthria or aphagia. Extraocular movements are full range without nystagmus. Fundi were not visualized. Vision acuity seems adequate. Partial left homonymous hemianopsia to bedside confrontational testing. Pupils equal reactive. Face appears symmetric with only mild minimal left lower facial asymmetry on smiling. Tongue midline. Motor system exam revealed no upper or lower extremity drift but mild weakness of left grip and intrinsic hand muscles. Orbits right over left upper extremity. Symmetric lower extremity strength. Deep tendon reflexes are 2+ symmetric. Plantars are downgoing. Touch and pinprick sensation are preserved bilaterally. Coordination is intact. Gait was not tested.  Discharge Diet: Heart healthy diet  DISCHARGE PLAN  Disposition: Discharged to home in the care of his wife.  No antithrombotic for secondary stroke prevention secondary to hemorrhage  Follow-up No primary care provider on file. in 2 weeks.  Follow-up with Dr. Delia HeadyPramod Darra Rosa, Stroke Clinic in 2 months.  The patient was instructed to obtain hospital records from this visit for his primary care physician in [place].  20 minutes were spent preparing discharge.  Delton Seeavid Rinehuls PA-C [facility] Pager [phone] 06/30/2015, 3:40 PM I have personally examined this patient, reviewed notes, independently viewed imaging studies, participated in medical decision making and plan of care. I have made any additions or clarifications directly to the above note. Agree with note above.   Delia HeadyPramod Ailynn Gow, MD Medical Director [facility] Pager: [phone] 07/02/2015 10:18 AM

## 2017-04-06 IMAGING — CT CT HEAD W/O CM
2 series · 15 of 30 positions shown, 17 images · non-contrast
Comparison: None.

CLINICAL DATA: Code stroke.  Left-sided weakness.

EXAM:
CT HEAD WITHOUT CONTRAST
TECHNIQUE: Contiguous axial images were obtained from the base of the skull
through the vertex without intravenous contrast.

[Series 2: head without · axial · non-contrast · 0.42mm/px · z∈[+1291,+1391]mm · 7 of 28 slices shown, 9 images]
[im 4/28  brain]
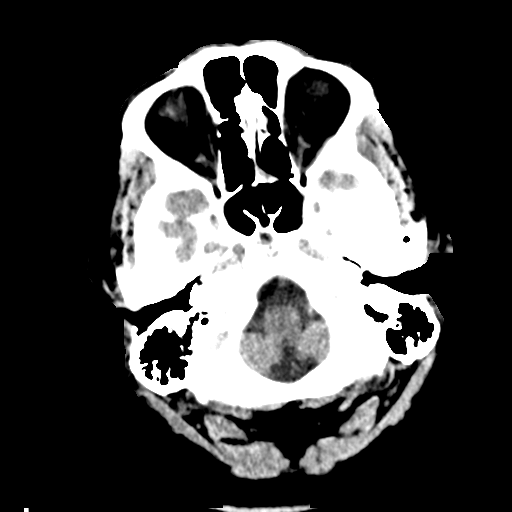
[im 4/28  bone]
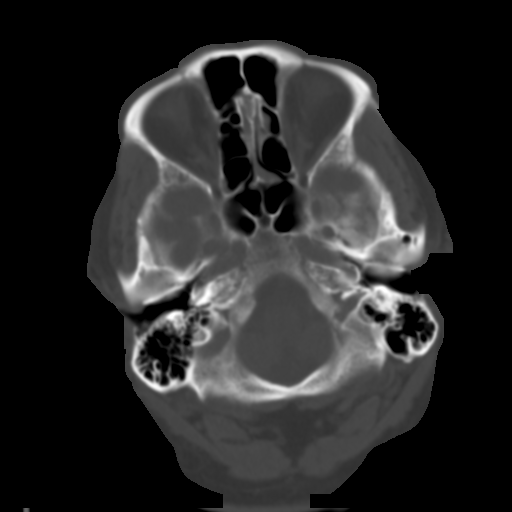
[im 7/28  brain]
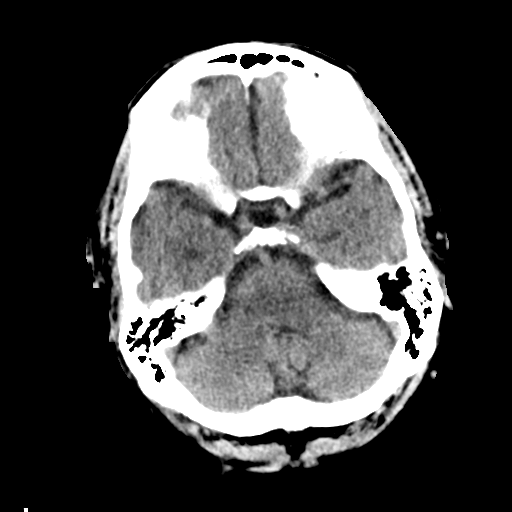
[im 11/28  brain]
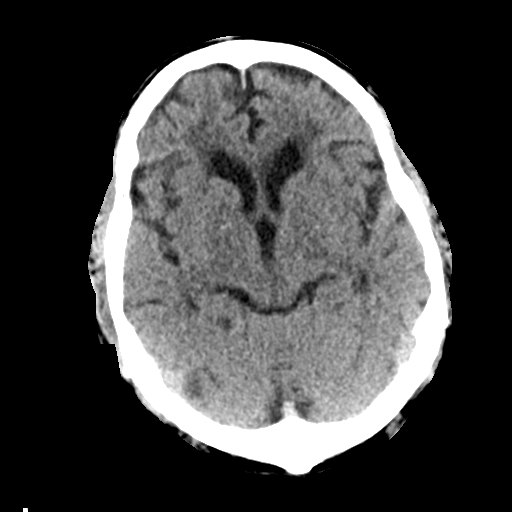
[im 14/28  brain]
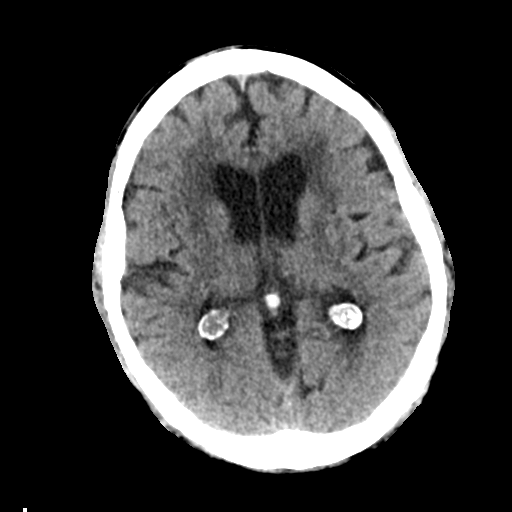
[im 17/28  brain]
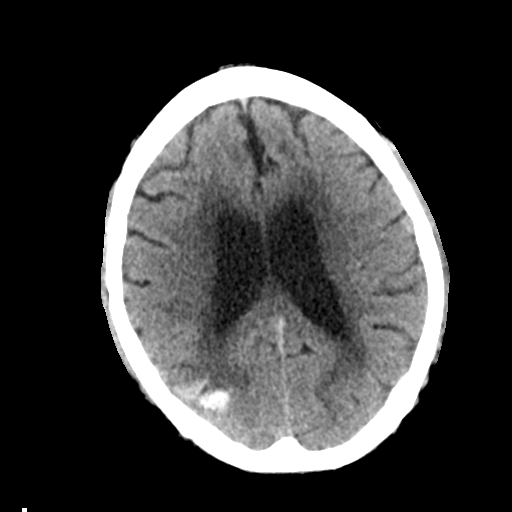
[im 17/28  bone]
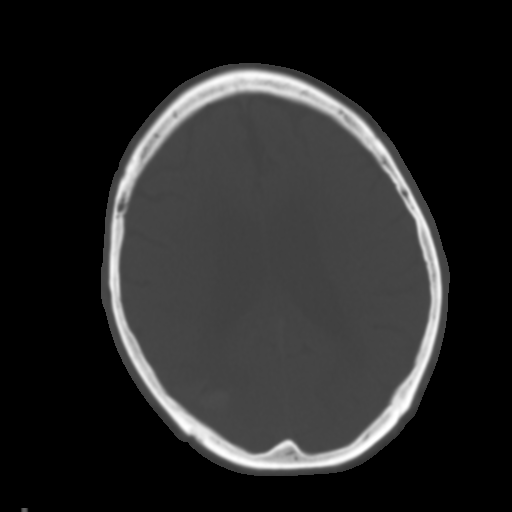
[im 21/28  brain]
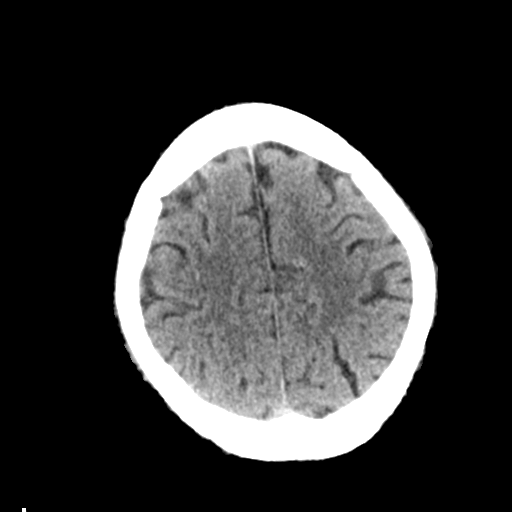
[im 24/28  brain]
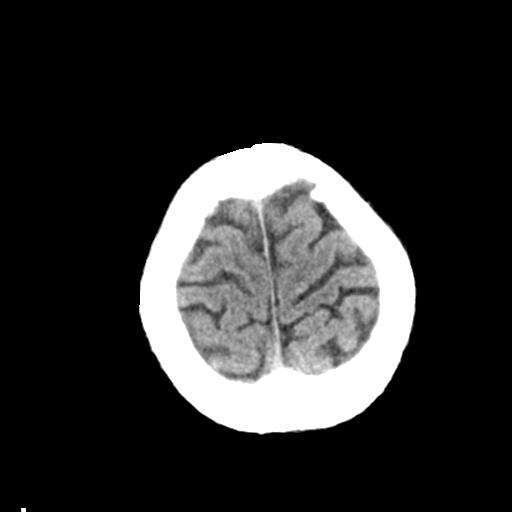

[Series 3: head bone · axial · 0.42mm/px · z∈[+1288,+1398]mm · 8 of 69 slices shown]
[im 7/69  bone]
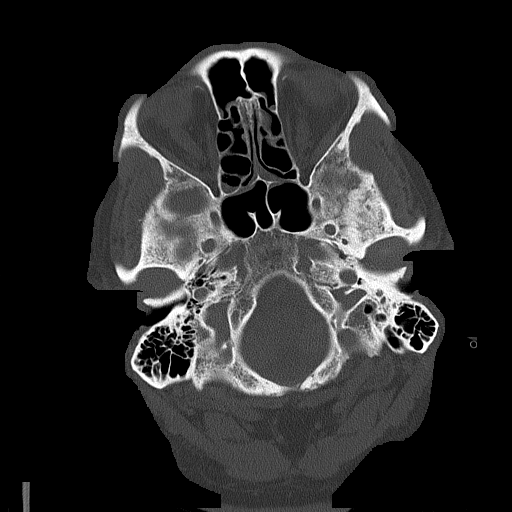
[im 14/69  bone]
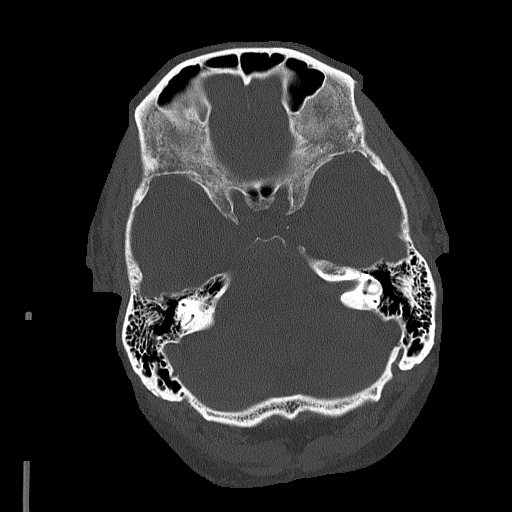
[im 21/69  bone]
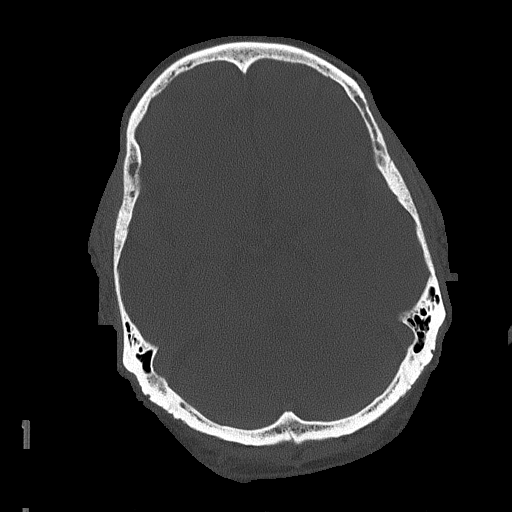
[im 31/69  bone]
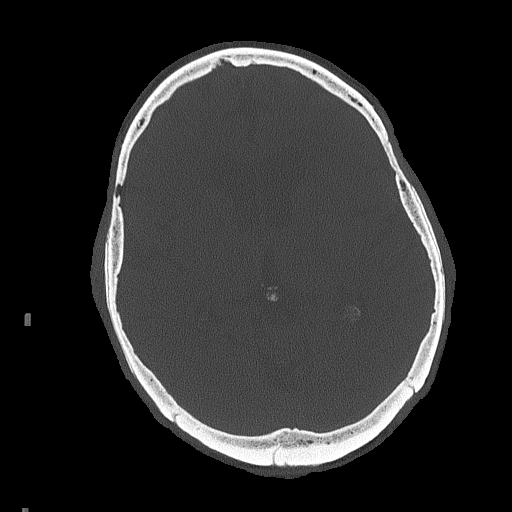
[im 38/69  bone]
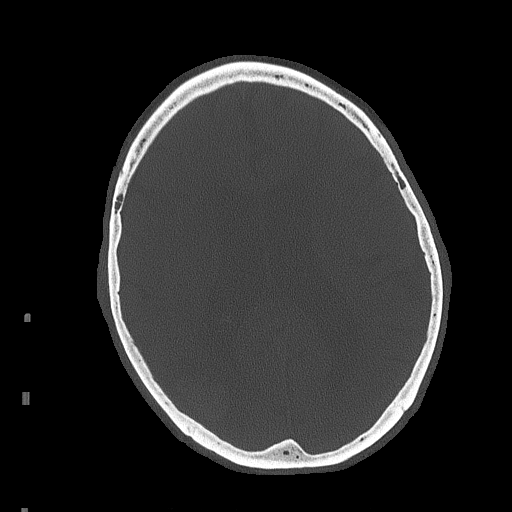
[im 48/69  bone]
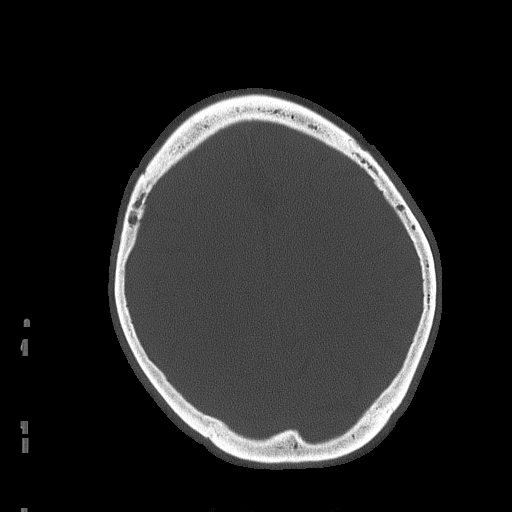
[im 55/69  bone]
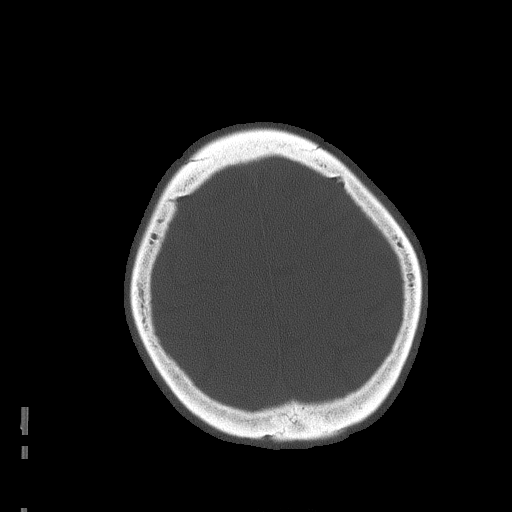
[im 62/69  bone]
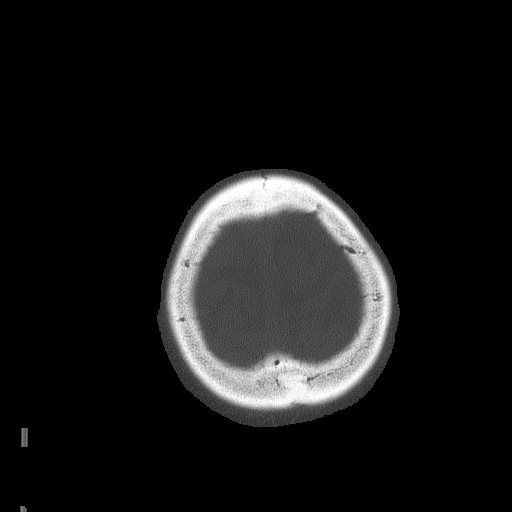

[15 of 30 positions shown; findings below may reference images not displayed]

FINDINGS: Acute hemorrhage in the right parietal lobe. This is a peripheral
parenchymal hemorrhage measuring 25 x 16 mm. There is a small amount
of adjacent subarachnoid hemorrhage. No subdural hemorrhage.

Moderate atrophy and moderate chronic microvascular ischemia.
Negative for acute ischemic infarction. No underlying mass lesion
identified

Calvarium intact.
IMPRESSION: 25 x 16 mm acute hemorrhage right parietal lobe with surrounding
edema and a small amount of subarachnoid hemorrhage. Differential
diagnosis includes amyloid, trauma, vascular malformation and
possibly underlying tumor. No definite tumor mass is identified.

Critical Value/emergent results were called by telephone at the time
of interpretation on 06/10/2015 at [DATE] to Dr. Pozisa, who
verbally acknowledged these results.

## 2017-04-07 IMAGING — CT CT HEAD W/O CM
2 series · 15 of 30 positions shown, 17 images · non-contrast
Comparison: Prior CT and MRI from 06/10/2015.

CLINICAL DATA: Followup intracranial hemorrhage.

EXAM:
CT HEAD WITHOUT CONTRAST
TECHNIQUE: Contiguous axial images were obtained from the base of the skull
through the vertex without intravenous contrast.

[Series 2: head without · axial · non-contrast · 0.45mm/px · z∈[-113,-3]mm · 7 of 30 slices shown, 9 images]
[im 4/30  brain]
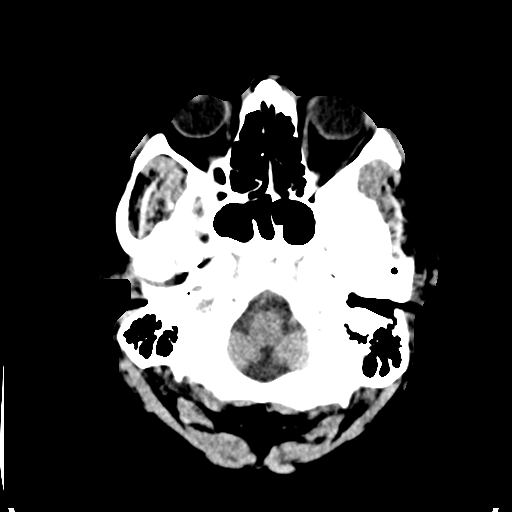
[im 4/30  bone]
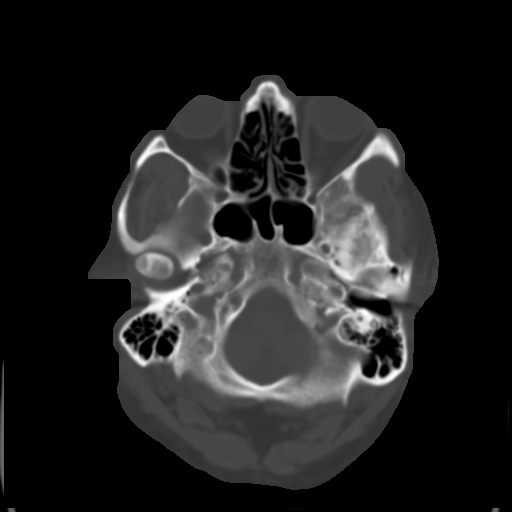
[im 8/30  brain]
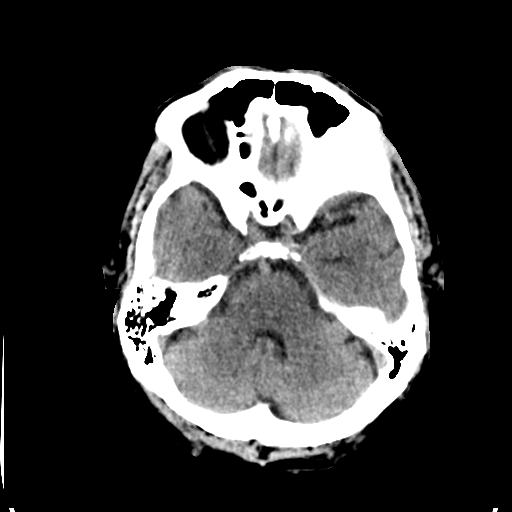
[im 11/30  brain]
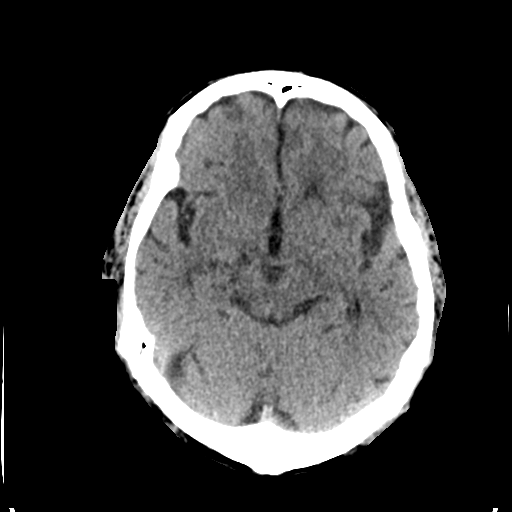
[im 15/30  brain]
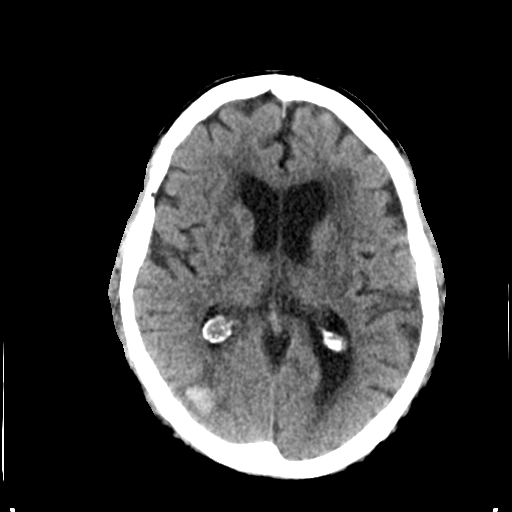
[im 19/30  brain]
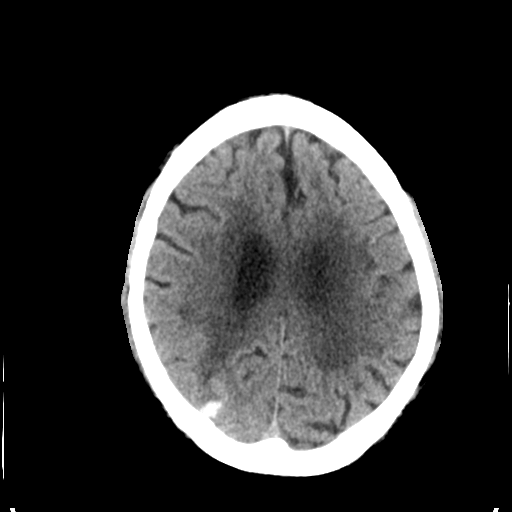
[im 19/30  bone]
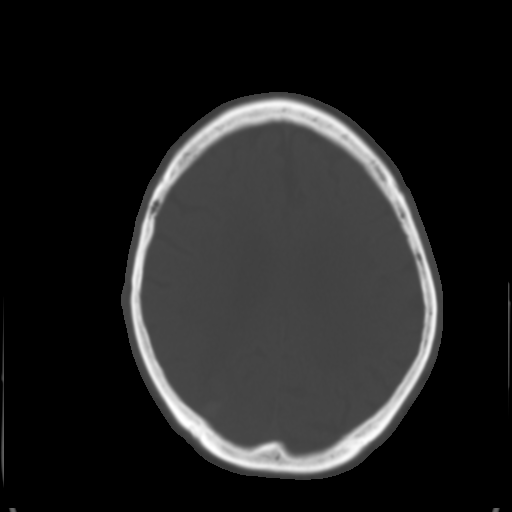
[im 22/30  brain]
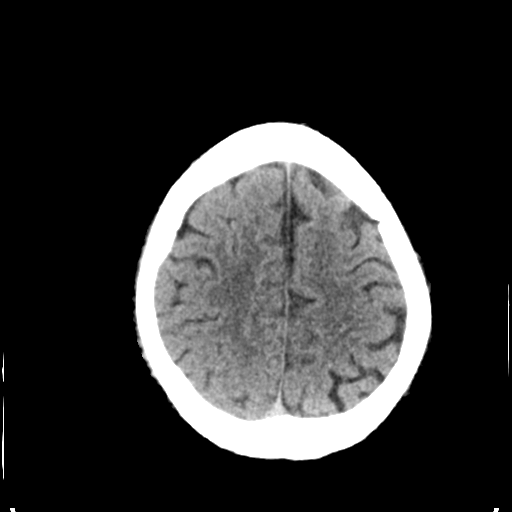
[im 26/30  brain]
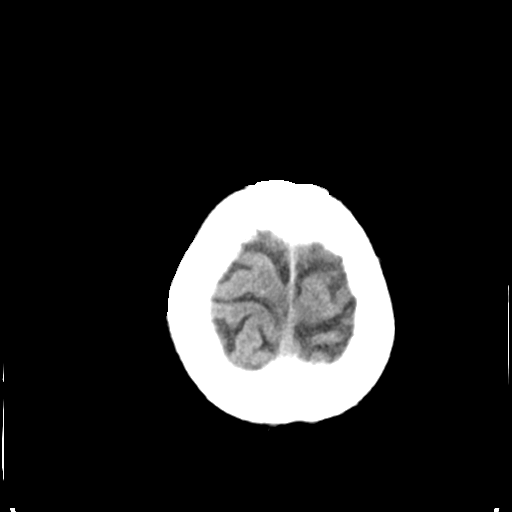

[Series 3: head bone · axial · 0.45mm/px · z∈[-114,-0]mm · 8 of 73 slices shown]
[im 8/73  bone]
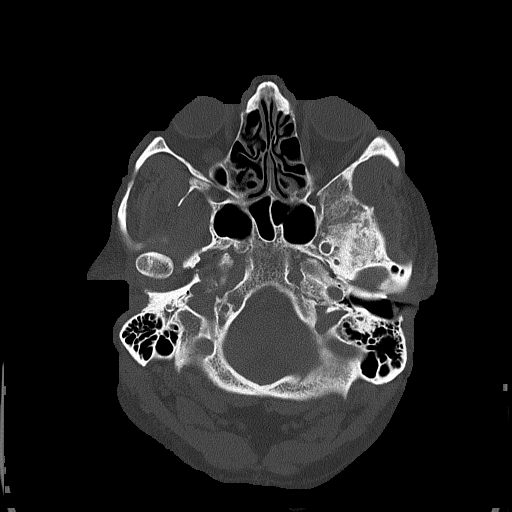
[im 15/73  bone]
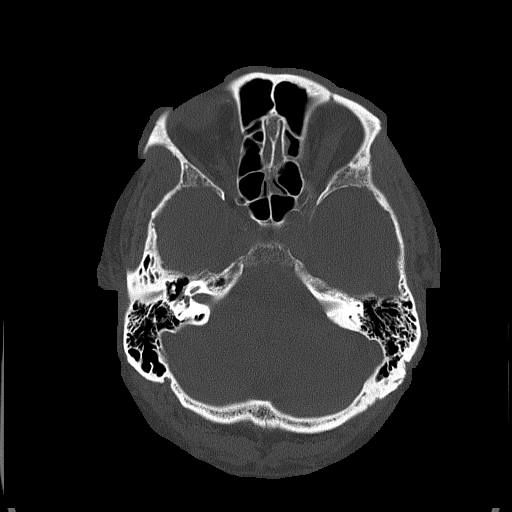
[im 22/73  bone]
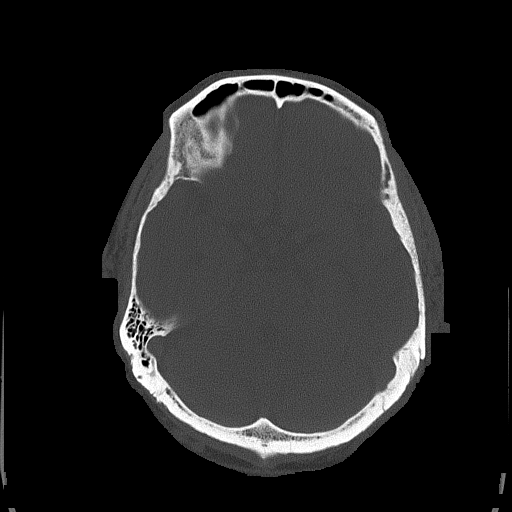
[im 33/73  bone]
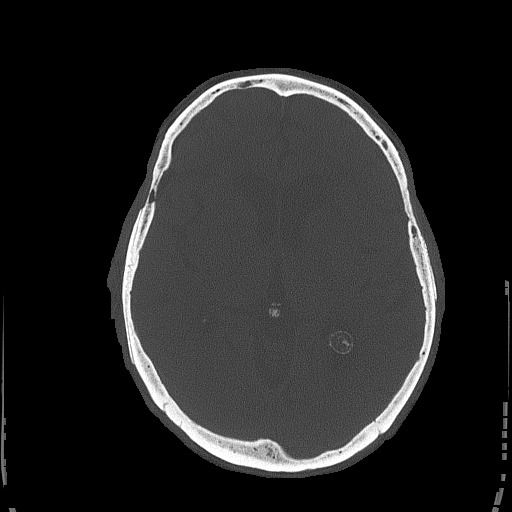
[im 40/73  bone]
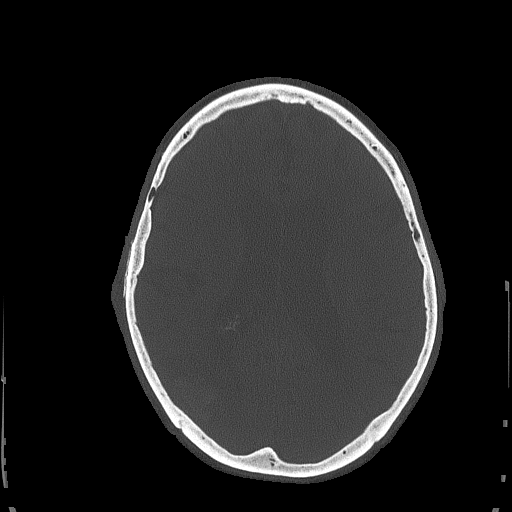
[im 51/73  bone]
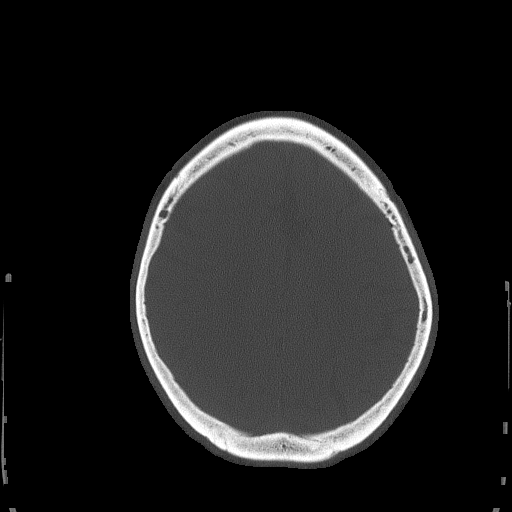
[im 58/73  bone]
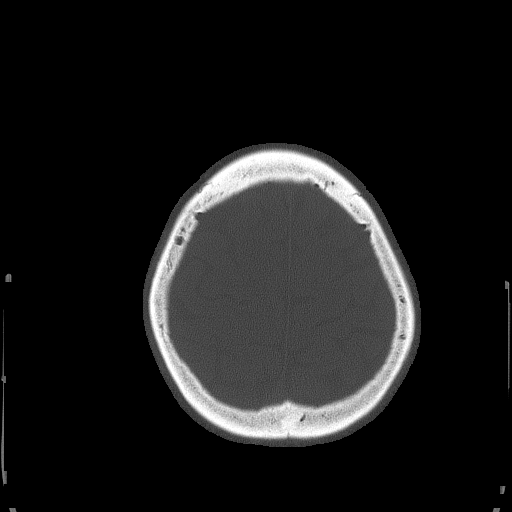
[im 65/73  bone]
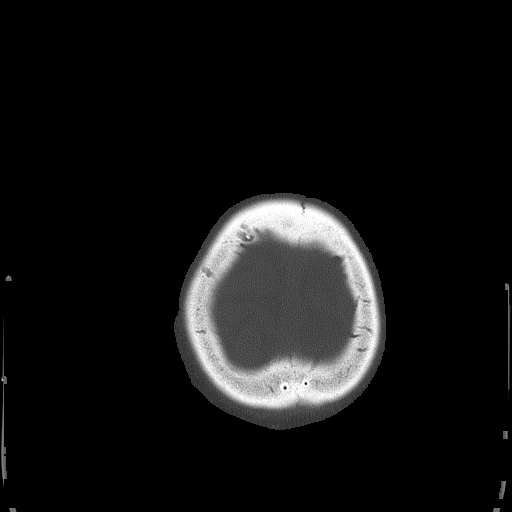

[15 of 30 positions shown; findings below may reference images not displayed]

FINDINGS: Parenchymal hemorrhage centered at the right occipital lobe is
relatively stable measuring 26 x 16 mm (series 2, image 17),
previously 26 x 16 mm). Localized vasogenic edema is not
significantly changed. Minimal effacement of the adjacent posterior
horn of the right lateral ventricle without other significant mass
effect. Trace subarachnoid hemorrhage more superiorly within the
right parietal lobe is slightly decreasing cons acuity as compared
to prior CT. No new hemorrhage.

No acute infarct. Chronic microvascular ischemic disease again
noted, stable. No midline shift or hydrocephalus. Basilar cisterns
are patent.

Scalp soft tissues within normal limits. No acute abnormality about
the orbits.

Scattered mucosal thickening within the ethmoidal air cells.
Paranasal sinuses are otherwise clear. No mastoid effusion.

Calvarium intact.
IMPRESSION: 1. Stable size of right occipital 26 x 16 mm with parenchymal
hemorrhage with mild localized vasogenic edema.
2. Slight interval decrease in adjacent small volume acute
subarachnoid hemorrhage within the right parietal lobe.
3. No other new intracranial process.
